# Patient Record
Sex: Male | Born: 1995 | Race: Black or African American | Hispanic: No | Marital: Single | Smoking: Current every day smoker
Health system: Southern US, Community
[De-identification: ages and names within clinical notes are randomized; demographics above are authoritative.]

## PROBLEM LIST (undated history)

## (undated) DIAGNOSIS — A64 Unspecified sexually transmitted disease: Secondary | ICD-10-CM

---
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
## 2015-02-23 ENCOUNTER — Encounter (HOSPITAL_COMMUNITY): Payer: Self-pay | Admitting: *Deleted

## 2015-02-23 ENCOUNTER — Emergency Department (HOSPITAL_COMMUNITY): Payer: Medicaid Other

## 2015-02-23 ENCOUNTER — Emergency Department (HOSPITAL_COMMUNITY)
Admission: EM | Admit: 2015-02-23 | Discharge: 2015-02-23 | Disposition: A | Discharge status: Home / Self Care | Payer: Medicaid Other | Attending: Emergency Medicine | Admitting: Emergency Medicine

## 2015-02-23 DIAGNOSIS — Z720 Tobacco use: Secondary | ICD-10-CM | POA: Diagnosis not present

## 2015-02-23 DIAGNOSIS — S62316A Displaced fracture of base of fifth metacarpal bone, right hand, initial encounter for closed fracture: Secondary | ICD-10-CM | POA: Insufficient documentation

## 2015-02-23 DIAGNOSIS — Y998 Other external cause status: Secondary | ICD-10-CM | POA: Insufficient documentation

## 2015-02-23 DIAGNOSIS — W228XXA Striking against or struck by other objects, initial encounter: Secondary | ICD-10-CM | POA: Diagnosis not present

## 2015-02-23 DIAGNOSIS — Y9289 Other specified places as the place of occurrence of the external cause: Secondary | ICD-10-CM | POA: Diagnosis not present

## 2015-02-23 DIAGNOSIS — S6991XA Unspecified injury of right wrist, hand and finger(s), initial encounter: Secondary | ICD-10-CM | POA: Diagnosis present

## 2015-02-23 DIAGNOSIS — Y9389 Activity, other specified: Secondary | ICD-10-CM | POA: Diagnosis not present

## 2015-02-23 DIAGNOSIS — S62339A Displaced fracture of neck of unspecified metacarpal bone, initial encounter for closed fracture: Secondary | ICD-10-CM

## 2015-02-23 MED ORDER — LIDOCAINE HCL 2 % IJ SOLN
20.0000 mL | Freq: Once | INTRAMUSCULAR | Status: AC
  Administered 2015-02-23: 400 mg via INTRADERMAL
  Filled 2015-02-23: qty 20

## 2015-02-23 MED ORDER — HYDROCODONE-ACETAMINOPHEN 5-325 MG PO TABS: ORAL_TABLET | ORAL | Status: DC

## 2015-02-23 MED ORDER — HYDROCODONE-ACETAMINOPHEN 5-325 MG PO TABS
1.0000 | ORAL_TABLET | Freq: Once | ORAL | Status: AC
Start: 2015-02-23 — End: 2015-02-23
  Administered 2015-02-23: 1 via ORAL
  Filled 2015-02-23: qty 1

## 2015-02-23 NOTE — Consult Note (Signed)
Reason for Consult:5th metacarpal Fx right hand closed Referring Physician: ER staff  Jerome Simpson is an 19 y.o. male.  HPI: Patient presents status post punching a wall earlier today with a swollen painful deformed right hand. He has a fifth metacarpal fracture displaced. I discussed these issues with him and his family. He denies other issues including neck back chest or abdominal pain. He is alert and oriented. He and I discussed all issues at length.  History reviewed. No pertinent past medical history.  History reviewed. No pertinent past surgical history.  No family history on file.  Social History:  reports that he has been smoking Cigarettes.  He has a 2.5 pack-year smoking history. He has never used smokeless tobacco. He reports that he drinks alcohol. He reports that he does not use illicit drugs.  Allergies: No Known Allergies  Medications: I have reviewed the patient's current medications.  No results found for this or any previous visit (from the past 48 hour(s)).  Dg Hand Complete Right  02/23/2015   CLINICAL DATA:  Hit a wall today  EXAM: RIGHT HAND - COMPLETE 3+ VIEW  COMPARISON:  None.  FINDINGS: Fracture the distal fifth metacarpal with moderate angulation. No other fracture identified. No arthropathy.  IMPRESSION: Angulated fracture fifth metacarpal.   Electronically Signed   By: Marlan Palauharles  Clark M.D.   On: 02/23/2015 17:41    Review of Systems  Constitutional: Negative.   Eyes: Negative.   Respiratory: Negative.   Cardiovascular: Negative.   Gastrointestinal: Negative.   Genitourinary: Negative.   Musculoskeletal:       Fifth metacarpal fracture right hand  Skin: Negative.   Neurological: Negative.   Endo/Heme/Allergies: Negative.   Psychiatric/Behavioral: Negative.    Blood pressure 125/77, pulse 58, temperature 98.2 F (36.8 C), temperature source Oral, resp. rate 16, height 6' (1.829 m), weight 77.111 kg (170 lb), SpO2 100 %. Physical Exam right fifth  closed metacarpal fracture he has no evidence of compartment syndrome or infection. No evidence of dystrophy or neurovascular compromise. He is incredibly painful and pain focused at this juncture. The patient is alert and oriented in no acute distress. The patient complains of pain in the affected upper extremity.  The patient is noted to have a normal HEENT exam. Lung fields show equal chest expansion and no shortness of breath. Abdomen exam is nontender without distention. Lower extremity examination does not show any fracture dislocation or blood clot symptoms. Pelvis is stable and the neck and back are stable and nontender. Assessment/Plan: Right fifth metacarpal fracture closed displaced.  Procedure note the patient was consented verbally and given a metacarpal/ulnar nerve block. Following this the patient underwent fluoroscopy and close reduction. The first reduction had to be adjusted as I felt we could improve it and I did so followed by application of a cast. 3 point mold was applied and postreduction x-rays looked excellent. The x-rays were performed examined and interpreted by myself and looked to be excellent.  Thus we performed #1 closed reduction fifth metacarpal fracture #2 ulnar nerve block with 20 mL of lidocaine 2% without and after #3 AP lateral and oblique x-rays by myself.  He'll return see me office in 7 days careful observation notify me same problems occur.  Keep bandage clean and dry.  Call for any problems.  No smoking.  Criteria for driving a car: you should be off your pain medicine for 7-8 hours, able to drive one handed(confident), thinking clearly and feeling able in your judgement to  drive. Continue elevation as it will decrease swelling.  If instructed by MD move your fingers within the confines of the bandage/splint.  Use ice if instructed by your MD. Call immediately for any sudden loss of feeling in your hand/arm or change in functional abilities of the  extremity.We recommend that you to take vitamin C 1000 mg a day to promote healing. We also recommend that if you require  pain medicine that you take a stool softener to prevent constipation as most pain medicines will have constipation side effects. We recommend either Peri-Colace or Senokot and recommend that you also consider adding MiraLAX to prevent the constipation affects from pain medicine if you are required to use them. These medicines are over the counter and maybe purchased at a local pharmacy. A cup of yogurt and a probiotic can also be helpful during the recovery process as the medicines can disrupt your intestinal environment.   Karen Chafe 02/23/2015, 9:40 PM

## 2015-02-23 NOTE — ED Notes (Signed)
The pt is c/o rt hand pain.  He struck a wall this am  Swelling and painful

## 2015-02-23 NOTE — Progress Notes (Signed)
Orthopedic Tech Progress Note Patient Details:  Jerome Simpson 02-28-1996 [id number]  Casting Type of Cast: Short arm cast Cast Location: RUE Cast Intervention: Application     Jennye MoccasinHughes, Seleny Allbright Craig 02/23/2015, 9:37 PM

## 2015-02-23 NOTE — ED Provider Notes (Signed)
CSN: [id number]     Arrival date & time 02/23/15  1632 History  This chart was scribed for non-physician practitioner Wynetta EmeryNicole Yee Gangi, PA working with Margarita Grizzleanielle Ray, MD, by Tanda RockersMargaux Venter, ED Scribe. This patient was seen in room TR11C/TR11C and the patient's care was started at 5:48 PM.     Chief Complaint  Patient presents with  . Hand Injury   The history is provided by the patient. No language interpreter was used.     HPI Comments: Jerome Simpson is a 19 y.o. male who is right hand dominant presents to the Emergency Department complaining of right hand injury that occurred earlier today. Pt states that he punched a wall, causing increased pain and swelling to the area. He has not taken any pain medication for the pain or iced his hand.  Pt denies any other symptoms. Pain is 8 out of 10 and exacerbated by movement and palpation.  History reviewed. No pertinent past medical history. History reviewed. No pertinent past surgical history. No family history on file. History  Substance Use Topics  . Smoking status: Current Every Day Smoker -- 0.50 packs/day for 5 years    Types: Cigarettes  . Smokeless tobacco: Never Used  . Alcohol Use: Yes    Review of Systems  A complete 10 system review of systems was obtained and all systems are negative except as noted in the HPI and PMH.    Allergies  Review of patient's allergies indicates no known allergies.  Home Medications   Prior to Admission medications   Medication Sig Start Date End Date Taking? Authorizing Provider  HYDROcodone-acetaminophen (NORCO/VICODIN) 5-325 MG per tablet Take 1-2 tablets by mouth every 6 hours as needed for pain and/or cough. 02/23/15   Yanitza Shvartsman, PA-C   Triage Vitals: BP 125/77 mmHg  Pulse 58  Temp(Src) 98.2 F (36.8 C) (Oral)  Resp 16  Ht 6' (1.829 m)  Wt 170 lb (77.111 kg)  BMI 23.05 kg/m2  SpO2 100%   Physical Exam  Constitutional: He is oriented to person, place, and time. He appears  well-developed and well-nourished. No distress.  HENT:  Head: Normocephalic and atraumatic.  Eyes: Conjunctivae and EOM are normal.  Neck: Neck supple. No tracheal deviation present.  Cardiovascular: Normal rate.   Pulmonary/Chest: Effort normal. No respiratory distress.  Musculoskeletal: Normal range of motion. He exhibits edema and tenderness.  No overlying skin changes to right hand: Fifth metacarpal is very swollen and tender to palpation range of motion to the fifth digit in flexion and extension is reduced secondary to pain.  Neurological: He is alert and oriented to person, place, and time.  Skin: Skin is warm and dry.  Psychiatric: He has a normal mood and affect. His behavior is normal.  Nursing note and vitals reviewed.   ED Course  Procedures (including critical care time)  DIAGNOSTIC STUDIES: Oxygen Saturation is 100% on RA, normal by my interpretation.    COORDINATION OF CARE: 5:49 PM-Discussed treatment plan which includes consult with hand surgeon and pain medication with pt at bedside and pt agreed to plan.   Labs Review Labs Reviewed - No data to display  Imaging Review Dg Hand Complete Right  02/23/2015   CLINICAL DATA:  Hit a wall today  EXAM: RIGHT HAND - COMPLETE 3+ VIEW  COMPARISON:  None.  FINDINGS: Fracture the distal fifth metacarpal with moderate angulation. No other fracture identified. No arthropathy.  IMPRESSION: Angulated fracture fifth metacarpal.   Electronically Signed   By: Leonette Mostharles  Chestine Spore M.D.   On: 02/23/2015 17:41     EKG Interpretation None      MDM   Final diagnoses:  Boxer's fracture, closed, initial encounter    Filed Vitals:   02/23/15 1636  BP: 125/77  Pulse: 58  Temp: 98.2 F (36.8 C)  TempSrc: Oral  Resp: 16  Height: 6' (1.829 m)  Weight: 170 lb (77.111 kg)  SpO2: 100%    Medications  HYDROcodone-acetaminophen (NORCO/VICODIN) 5-325 MG per tablet 1 tablet (1 tablet Oral Given 02/23/15 1806)  lidocaine (XYLOCAINE) 2 %  (with pres) injection 400 mg (400 mg Intradermal Given 02/23/15 2044)    Jerome Simpson is a pleasant 19 y.o. male presenting with pain to fifth metacarpal of right hand after he punched a wall yesterday. Skin is undisturbed, no signs of flight bite. Patient has reduced range of motion to the small digit he is neurovascularly intact.  X-ray shows a angulated boxer's fracture. Patient given Vicodin, sling and ice.  Case discussed with Dr. Amanda Pea and who will reduce the dislocation in the ED. He is going to the OR now. Discussed case with patient and his mother who agreed to wait. Patient told to remain nothing by mouth in case he needs to go to the OR.  Dr. Amanda Pea is reduced and splinted the hand under portable fluoroscopy.   Evaluation does not show pathology that would require ongoing emergent intervention or inpatient treatment. Pt is hemodynamically stable and mentating appropriately. Discussed findings and plan with patient/guardian, who agrees with care plan. All questions answered. Return precautions discussed and outpatient follow up given.   Discharge Medication List as of 02/23/2015  9:36 PM    START taking these medications   Details  HYDROcodone-acetaminophen (NORCO/VICODIN) 5-325 MG per tablet Take 1-2 tablets by mouth every 6 hours as needed for pain and/or cough., Print         I personally performed the services described in this documentation, which was scribed in my presence. The recorded information has been reviewed and is accurate.     Wynetta Emery, PA-C 02/23/15 1610  Margarita Grizzle, MD 02/24/15 [phone]

## 2015-02-23 NOTE — Discharge Instructions (Signed)
Only use the arm sling for up to 2 days. Take the arm out and rotate the shoulder every 4 hours.   Rest, Ice intermittently (in the first 24-48 hours), Gentle compression with an Ace wrap, and elevate (Limb above the level of the heart)   Take up to 400mg  of ibuprofen (that is usually 2 over the counter pills)  3 times a day for 5 days. Take with food.  Please follow with your primary care doctor in the next 2 days for a check-up. They must obtain records for further management.   Do not hesitate to return to the Emergency Department for any new, worsening or concerning symptoms.     Boxer's Fracture Boxer's fracture is a broken bone (fracture) of the fourth or fifth metacarpal (ring or pinky finger). The metacarpal bones connect the wrist to the fingers and make up the arch of the hand. Boxer's fracture occurs toward the body (proximal) from the first knuckle. This injury is known as a boxer's fracture, because it often occurs from hitting an object with a closed fist. SYMPTOMS   Severe pain at the time of injury.  Pain and swelling around the first knuckle on the fourth or fifth finger.  Bruising (contusion) in the area within 48 hours of injury.  Visible deformity, such as a pushed down knuckle. This can occur if the fracture is complete, and the bone fragments separate enough to distort normal body shape.  Numbness or paralysis from swelling in the hand, causing pressure on the blood vessels or nerves (uncommon). CAUSES   Direct injury (trauma), such as a striking blow with the fist.  Indirect stress to the hand, such as twisting or violent muscle contraction (uncommon). RISK INCREASES WITH:  Punching an object with an unprotected knuckle.  Contact sports (football, rugby).  Sports that require hitting (boxing, martial arts).  History of bone or joint disease (osteoporosis). PREVENTION  Maintain physical fitness:  Muscle strength and  flexibility.  Endurance.  Cardiovascular fitness.  For participation in contact sports, wear proper protective equipment for the hand and make sure it fits properly.  Learn and use proper technique when hitting or punching. PROGNOSIS  When proper treatment is given, to ensure normal alignment of the bones, healing can usually be expected in 4 to 6 weeks. Occasionally, surgery is necessary.  RELATED COMPLICATIONS   Bone does not heal back together (nonunion).  Bone heals together in an improper position (malunion), causing twisting of the finger when making a fist.  Chronic pain, stiffness, or swelling of the hand.  Excessive bleeding in the hand, causing pressure and injury to nerves and blood vessels (rare).  Stopping of normal hand growth in children.  Infection of the wound, if skin is broken over the fracture (open fracture), or at the incision site if surgery is performed.  Shortening of injured bones.  Bony bump (prominence) in the palm or loss of shape of the knuckles.  Pain and weakness when gripping.  Arthritis of the affected joint, if the fracture goes into the joint, after repeated injury, or after delayed treatment.  Scarring around the knuckle, and limited motion. TREATMENT  Treatment varies, depending on the injury. The place in the hand where the injury occurs has a great deal of motion, which allows the hand to move properly. If the fracture is not aligned properly, this function may be decreased. If the bone ends are in proper alignment, treatment first involves ice and elevation of the injured hand, at or  above heart level, to reduce inflammation. Pain medicines help to relieve pain. Treatment also involves restraint by splinting, bandaging, casting, or bracing for 4 or more weeks.  If the fracture is out of alignment (displaced), or it involves the joint, surgery is usually recommended. Surgery typically involves cutting through the skin to place removable  pins, screws, and sometimes plates over the fracture. After surgery, the bone and joint are restrained for 4 or more weeks. After restraint (with or without surgery), stretching and strengthening exercises are needed to regain proper strength and function of the joint. Exercises may be done at home or with the assistance of a therapist. Depending on the sport and position played, a brace or splint may be recommended when first returning to sports.  MEDICATION   If pain medication is necessary, nonsteroidal anti-inflammatory medications, such as aspirin and ibuprofen, or other minor pain relievers, such as acetaminophen, are often recommended.  Do not take pain medication for 7 days before surgery.  Prescription pain relievers may be necessary. Use only as directed and only as much as you need. COLD THERAPY Cold treatment (icing) relieves pain and reduces inflammation. Cold treatment should be applied for 10 to 15 minutes every 2 to 3 hours for inflammation and pain, and immediately after any activity that aggravates your symptoms. Use ice packs or an ice massage. SEEK MEDICAL CARE IF:   Pain, tenderness, or swelling gets worse, despite treatment.  You experience pain, numbness, or coldness in the hand.  Blue, gray, or dark color appears in the fingernails.  You develop signs of infection, after surgery (fever, increased pain, swelling, redness, drainage of fluids, or bleeding in the surgical area).  You feel you have reinjured the hand.  New, unexplained symptoms develop. (Drugs used in treatment may produce side effects.) Document Released: 10/20/2005 Document Revised: 01/12/2012 Document Reviewed: 02/01/2009 [facility] Patient Information 2015 [place], [place]. This information is not intended to replace advice given to you by your health care provider. Make sure you discuss any questions you have with your health care provider.

## 2015-02-23 NOTE — ED Notes (Signed)
Dr.Gramig in to assess pt at this time. 

## 2015-07-09 ENCOUNTER — Encounter (HOSPITAL_COMMUNITY): Payer: Self-pay | Admitting: Emergency Medicine

## 2015-07-09 ENCOUNTER — Emergency Department (HOSPITAL_COMMUNITY)
Admission: EM | Admit: 2015-07-09 | Discharge: 2015-07-09 | Disposition: A | Discharge status: Home / Self Care | Payer: Medicaid Other | Attending: Emergency Medicine | Admitting: Emergency Medicine

## 2015-07-09 DIAGNOSIS — Z202 Contact with and (suspected) exposure to infections with a predominantly sexual mode of transmission: Secondary | ICD-10-CM | POA: Diagnosis not present

## 2015-07-09 DIAGNOSIS — R360 Urethral discharge without blood: Secondary | ICD-10-CM | POA: Diagnosis not present

## 2015-07-09 DIAGNOSIS — R369 Urethral discharge, unspecified: Secondary | ICD-10-CM

## 2015-07-09 DIAGNOSIS — Z720 Tobacco use: Secondary | ICD-10-CM | POA: Insufficient documentation

## 2015-07-09 MED ORDER — AZITHROMYCIN 250 MG PO TABS
1000.0000 mg | ORAL_TABLET | Freq: Once | ORAL | Status: AC
  Administered 2015-07-09: 1000 mg via ORAL
  Filled 2015-07-09: qty 4

## 2015-07-09 MED ORDER — CEFTRIAXONE SODIUM 250 MG IJ SOLR
250.0000 mg | Freq: Once | INTRAMUSCULAR | Status: AC
  Administered 2015-07-09: 250 mg via INTRAMUSCULAR
  Filled 2015-07-09: qty 250

## 2015-07-09 MED ORDER — LIDOCAINE HCL (PF) 1 % IJ SOLN
5.0000 mL | Freq: Once | INTRAMUSCULAR | Status: AC
  Administered 2015-07-09: 5 mL
  Filled 2015-07-09: qty 5

## 2015-07-09 NOTE — Discharge Instructions (Signed)

## 2015-07-09 NOTE — ED Notes (Signed)
Pt from home for clear penile discharge x1 day, denies any pain or n/v/d at this time. States "I don't feel right." pt eating a sandwhich as this RN trying to assess pt and on telephone. Pt states he has not been told by sexual partners that he was exposed. No other complaints.

## 2015-07-09 NOTE — ED Provider Notes (Signed)
CSN: [id number]     Arrival date & time 07/09/15  1409 History  This chart was scribed for non-physician practitioner, Roxy Horseman, PA-C working with Tilden Fossa, MD by Doreatha Martin, ED scribe. This patient was seen in room TR09C/TR09C and the patient's care was started at 2:40 PM     Chief Complaint  Patient presents with  . SEXUALLY TRANSMITTED DISEASE   The history is provided by the patient. No language interpreter was used.    HPI Comments: Jerome Simpson is a 19 y.o. male who presents to the Emergency Department with a chief complaint of clear penile discharge onset yesterday. Pt reports that he believes he was exposed to Chlamydia. He states that he is not sure of who he had exposure from. He denies dysuria, fever, chills, testicular pain, penile pain.   History reviewed. No pertinent past medical history. History reviewed. No pertinent past surgical history. No family history on file. Social History  Substance Use Topics  . Smoking status: Current Every Day Smoker -- 0.50 packs/day for 5 years    Types: Cigarettes  . Smokeless tobacco: Never Used  . Alcohol Use: Yes    Review of Systems  Constitutional: Negative for fever and chills.  Genitourinary: Positive for discharge ( clear). Negative for dysuria, penile pain and testicular pain.   Allergies  Review of patient's allergies indicates no known allergies.  Home Medications   Prior to Admission medications   Medication Sig Start Date End Date Taking? Authorizing Provider  HYDROcodone-acetaminophen (NORCO/VICODIN) 5-325 MG per tablet Take 1-2 tablets by mouth every 6 hours as needed for pain and/or cough. 02/23/15   Nicole Pisciotta, PA-C   BP 119/77 mmHg  Pulse 100  Temp(Src) 99.8 F (37.7 C) (Oral)  Resp 16  Ht 6' (1.829 m)  Wt 157 lb 6.4 oz (71.396 kg)  BMI 21.34 kg/m2  SpO2 97% Physical Exam  Constitutional: He is oriented to person, place, and time. He appears well-developed and well-nourished.  HENT:   Head: Normocephalic and atraumatic.  Eyes: Conjunctivae and EOM are normal. Pupils are equal, round, and reactive to light.  Neck: Normal range of motion. Neck supple.  Cardiovascular: Normal rate.   Pulmonary/Chest: Effort normal. No respiratory distress.  Abdominal: He exhibits no distension.  Genitourinary:  Circumcised male, no discharge, masses, lesions, or other abnormality  Musculoskeletal: Normal range of motion.  Neurological: He is alert and oriented to person, place, and time.  Skin: Skin is warm and dry.  Psychiatric: He has a normal mood and affect. His behavior is normal.  Nursing note and vitals reviewed.  ED Course  Procedures (including critical care time) DIAGNOSTIC STUDIES: Oxygen Saturation is 97% on RA, normal by my interpretation.    COORDINATION OF CARE: 2:43 PM Discussed treatment plan with pt at bedside and pt agreed to plan.   Labs Review Labs Reviewed - No data to display  Imaging Review No results found. I have personally reviewed and evaluated these images and lab results as part of my medical decision-making.   EKG Interpretation None      MDM   Final diagnoses:  STD exposure  Penile discharge    Patient with STD exposure and penile discharge.  Will treat with rocephin and azithro.  I personally performed the services described in this documentation, which was scribed in my presence. The recorded information has been reviewed and is accurate.    Roxy Horseman, PA-C 07/09/15 1540  Tilden Fossa, MD 07/09/15 [phone]

## 2015-07-09 NOTE — ED Notes (Signed)
Declined W/C at D/C and was escorted to lobby by RN. 

## 2015-07-10 LAB — GC/CHLAMYDIA PROBE AMP (~~LOC~~) NOT AT ARMC
Chlamydia: POSITIVE — AB
Neisseria Gonorrhea: NEGATIVE

## 2015-07-11 ENCOUNTER — Telehealth (HOSPITAL_COMMUNITY): Payer: Self-pay | Admitting: Emergency Medicine

## 2015-07-11 NOTE — Telephone Encounter (Signed)
Positive Chlamydia culture Treated with Rocephin and Zithromax [facility] faxed  Will contact patient.

## 2015-07-12 ENCOUNTER — Telehealth (HOSPITAL_BASED_OUTPATIENT_CLINIC_OR_DEPARTMENT_OTHER): Payer: Self-pay | Admitting: *Deleted

## 2015-07-13 ENCOUNTER — Telehealth (HOSPITAL_BASED_OUTPATIENT_CLINIC_OR_DEPARTMENT_OTHER): Payer: Self-pay | Admitting: Emergency Medicine

## 2015-08-12 ENCOUNTER — Telehealth (HOSPITAL_COMMUNITY): Payer: Self-pay

## 2015-08-12 NOTE — Telephone Encounter (Signed)
Unable to reach by phone or mail.  Chart closed.   

## 2016-02-15 ENCOUNTER — Encounter (HOSPITAL_COMMUNITY): Payer: Self-pay | Admitting: Emergency Medicine

## 2016-02-15 ENCOUNTER — Emergency Department (HOSPITAL_COMMUNITY)
Admission: EM | Admit: 2016-02-15 | Discharge: 2016-02-16 | Disposition: A | Discharge status: Home / Self Care | Payer: Medicaid Other | Attending: Emergency Medicine | Admitting: Emergency Medicine

## 2016-02-15 ENCOUNTER — Emergency Department (HOSPITAL_COMMUNITY): Payer: Medicaid Other

## 2016-02-15 DIAGNOSIS — J40 Bronchitis, not specified as acute or chronic: Secondary | ICD-10-CM | POA: Diagnosis not present

## 2016-02-15 DIAGNOSIS — F17210 Nicotine dependence, cigarettes, uncomplicated: Secondary | ICD-10-CM | POA: Diagnosis not present

## 2016-02-15 DIAGNOSIS — J069 Acute upper respiratory infection, unspecified: Secondary | ICD-10-CM | POA: Insufficient documentation

## 2016-02-15 DIAGNOSIS — R079 Chest pain, unspecified: Secondary | ICD-10-CM | POA: Diagnosis present

## 2016-02-15 DIAGNOSIS — R000 Tachycardia, unspecified: Secondary | ICD-10-CM | POA: Diagnosis not present

## 2016-02-15 LAB — BASIC METABOLIC PANEL
ANION GAP: 9 (ref 5–15)
BUN: 6 mg/dL (ref 6–20)
CALCIUM: 9.1 mg/dL (ref 8.9–10.3)
CHLORIDE: 104 mmol/L (ref 101–111)
CO2: 27 mmol/L (ref 22–32)
Creatinine, Ser: 1.07 mg/dL (ref 0.61–1.24)
GFR calc Af Amer: >60 mL/min (ref >60)
GFR calc non Af Amer: >60 mL/min (ref >60)
GLUCOSE: 103 mg/dL — AB (ref 65–99)
POTASSIUM: 3.8 mmol/L (ref 3.5–5.1)
Sodium: 140 mmol/L (ref 135–145)

## 2016-02-15 LAB — CBC
HCT: 43.4 % (ref 39.0–52.0)
HEMOGLOBIN: 15.1 g/dL (ref 13.0–17.0)
MCH: 30.4 pg (ref 26.0–34.0)
MCHC: 34.8 g/dL (ref 30.0–36.0)
MCV: 87.5 fL (ref 78.0–100.0)
Platelets: 199 10*3/uL (ref 150–400)
RBC: 4.96 MIL/uL (ref 4.22–5.81)
RDW: 12.7 % (ref 11.5–15.5)
WBC: 7.7 10*3/uL (ref 4.0–10.5)

## 2016-02-15 LAB — I-STAT TROPONIN, ED: TROPONIN I, POC: 0 ng/mL (ref 0.00–0.08)

## 2016-02-15 MED ORDER — PREDNISONE 20 MG PO TABS
60.0000 mg | ORAL_TABLET | Freq: Once | ORAL | Status: AC
  Administered 2016-02-15: 60 mg via ORAL
  Filled 2016-02-15: qty 3

## 2016-02-15 MED ORDER — ALBUTEROL SULFATE HFA 108 (90 BASE) MCG/ACT IN AERS
2.0000 | INHALATION_SPRAY | Freq: Once | RESPIRATORY_TRACT | Status: AC
  Administered 2016-02-15: 2 via RESPIRATORY_TRACT
  Filled 2016-02-15: qty 6.7

## 2016-02-15 MED ORDER — KETOROLAC TROMETHAMINE 30 MG/ML IJ SOLN
30.0000 mg | Freq: Once | INTRAMUSCULAR | Status: AC
  Administered 2016-02-15: 30 mg via INTRAVENOUS
  Filled 2016-02-15: qty 1

## 2016-02-15 NOTE — ED Provider Notes (Signed)
CSN: [id number]     Arrival date & time 02/15/16  2156 History  By signing my name below, I, Doreatha MartinEva Mathews, attest that this documentation has been prepared under the direction and in the presence of Shon Batonourtney F Gabriela Irigoyen, MD. Electronically Signed: Doreatha MartinEva Mathews, ED Scribe. 02/15/2016. 11:26 PM.     Chief Complaint  Patient presents with  . Chest Pain   The history is provided by the patient. No language interpreter was used.   HPI Comments: Jerome Simpson is a 20 y.o. male otherwise healthy on no daily medications who presents to the Emergency Department complaining of moderate, sharp, 8/10 substernal and right-sided CP onset this morning with associated SOB, subjective fever. Pt also reports productive cough with onset this week. Per pt, he ran, smoked cigarettes and marijuana prior to the onset of his CP today, but his pain is not currently exertional. He states his pain is worsened in certain positions and with deep inhalation. Pt denies taking OTC medications at home to improve symptoms. Denies cocaine use. He is currently a daily marijuana and tobacco smoker.  No Hx of PE/DVT, recent long travel, surgery, fracture, prolonged immobilization. NKDA. Denies diaphoresis, recent leg swelling.   History reviewed. No pertinent past medical history. History reviewed. No pertinent past surgical history. No family history on file. Social History  Substance Use Topics  . Smoking status: Current Every Day Smoker -- 0.00 packs/day for 0 years    Types: Cigarettes  . Smokeless tobacco: Never Used  . Alcohol Use: Yes    Review of Systems  Constitutional: Positive for fever (subjective) and chills. Negative for diaphoresis.  Respiratory: Positive for cough and shortness of breath.   Cardiovascular: Positive for chest pain. Negative for leg swelling.  All other systems reviewed and are negative.  Allergies  Review of patient's allergies indicates no known allergies.  Home Medications   Prior to  Admission medications   Medication Sig Start Date End Date Taking? Authorizing Provider  albuterol (PROVENTIL HFA;VENTOLIN HFA) 108 (90 Base) MCG/ACT inhaler Inhale 2 puffs into the lungs every 4 (four) hours as needed for wheezing or shortness of breath. 02/16/16   Shon Batonourtney F Chrisa Hassan, MD  ibuprofen (ADVIL,MOTRIN) 600 MG tablet Take 1 tablet (600 mg total) by mouth every 6 (six) hours as needed. 02/16/16   Shon Batonourtney F Montrice Montuori, MD  predniSONE (DELTASONE) 20 MG tablet Take 3 tablets (60 mg total) by mouth daily with breakfast. 02/16/16   Shon Batonourtney F Hayes Rehfeldt, MD   BP 108/64 mmHg  Pulse 88  Temp(Src) 100.3 F (37.9 C) (Oral)  Resp 20  Wt 175 lb (79.379 kg)  SpO2 97% Physical Exam  Constitutional: He is oriented to person, place, and time. He appears well-developed and well-nourished. No distress.  HENT:  Head: Normocephalic and atraumatic.  Eyes: Pupils are equal, round, and reactive to light.  Cardiovascular: Normal rate, regular rhythm and normal heart sounds.   No murmur heard. Pulmonary/Chest: Effort normal. No respiratory distress. He has wheezes. He exhibits no tenderness.  Diffuse expiratory wheezing  Abdominal: Soft. Bowel sounds are normal. There is no tenderness. There is no rebound.  Musculoskeletal: He exhibits no edema.  Neurological: He is alert and oriented to person, place, and time.  Skin: Skin is warm and dry.  Psychiatric: He has a normal mood and affect.  Nursing note and vitals reviewed.   ED Course  Procedures (including critical care time) DIAGNOSTIC STUDIES: Oxygen Saturation is 100% on RA, normal by my interpretation.    COORDINATION  OF CARE: 11:21 PM Discussed treatment plan with pt at bedside which includes lab work, CXR, EKG and pt agreed to plan.   Labs Review Labs Reviewed  BASIC METABOLIC PANEL - Abnormal; Notable for the following:    Glucose, Bld 103 (*)    All other components within normal limits  CBC  I-STAT TROPOININ, ED    Imaging Review Dg  Chest 2 View  02/15/2016  CLINICAL DATA:  Mid chest pain and shortness of breath. EXAM: CHEST  2 VIEW COMPARISON:  None. FINDINGS: Trachea is midline. Heart size normal. Lungs are clear. No pleural fluid. IMPRESSION: Negative. Electronically Signed   By: Leanna Battles M.D.   On: 02/15/2016 22:32   I have personally reviewed and evaluated these images and lab results as part of my medical decision-making.   EKG Interpretation   Date/Time:  Friday February 15 2016 22:07:33 EDT Ventricular Rate:  101 PR Interval:  166 QRS Duration: 90 QT Interval:  320 QTC Calculation: 414 R Axis:   75 Text Interpretation:  Sinus tachycardia Moderate voltage criteria for LVH,  may be normal variant Borderline ECG No prior for comparison Confirmed by  Flavia Bruss  MD, Toni Amend ([id number]) on 02/15/2016 11:19:06 PM      MDM   Final diagnoses:  Bronchitis  URI (upper respiratory infection)    Patient presents for chest pain, shortness of breath, and noted here to have a fever. He's wheezing on exam. Patient was given an inhaler, Toradol, and prednisone. He is a smoker. Workup is largely reassuring. He is mildly tachycardic but that is likely related to fever. Doubt PE.  1:41 AM On recheck, patient reports improvement of his shortness of breath. He is no longer wheezing. Chest pain is improved with treatment. Likely acute bronchitis viral etiology. Will discharge home with hfa and prednisone.  After history, exam, and medical workup I feel the patient has been appropriately medically screened and is safe for discharge home. Pertinent diagnoses were discussed with the patient. Patient was given return precautions.  I personally performed the services described in this documentation, which was scribed in my presence. The recorded information has been reviewed and is accurate.   Shon Baton, MD 02/16/16 [phone]

## 2016-02-15 NOTE — ED Notes (Signed)
Pt. reports central chest pain/tightness with SOB , productive cough and emesis onset today , denies diaphoresis or fever .

## 2016-02-16 MED ORDER — ALBUTEROL SULFATE HFA 108 (90 BASE) MCG/ACT IN AERS: 2.0000 | INHALATION_SPRAY | RESPIRATORY_TRACT | Status: DC | PRN

## 2016-02-16 MED ORDER — IBUPROFEN 600 MG PO TABS: 600.0000 mg | ORAL_TABLET | Freq: Four times a day (QID) | ORAL | Status: DC | PRN

## 2016-02-16 MED ORDER — PREDNISONE 20 MG PO TABS
60.0000 mg | ORAL_TABLET | Freq: Every day | ORAL | Status: DC
Start: 2016-02-16 — End: 2016-06-13

## 2016-02-16 NOTE — Discharge Instructions (Signed)

## 2016-03-29 ENCOUNTER — Encounter (HOSPITAL_COMMUNITY): Payer: Self-pay | Admitting: *Deleted

## 2016-03-29 DIAGNOSIS — Y9389 Activity, other specified: Secondary | ICD-10-CM | POA: Insufficient documentation

## 2016-03-29 DIAGNOSIS — Y998 Other external cause status: Secondary | ICD-10-CM | POA: Insufficient documentation

## 2016-03-29 DIAGNOSIS — Y9289 Other specified places as the place of occurrence of the external cause: Secondary | ICD-10-CM | POA: Diagnosis not present

## 2016-03-29 DIAGNOSIS — X58XXXA Exposure to other specified factors, initial encounter: Secondary | ICD-10-CM | POA: Insufficient documentation

## 2016-03-29 DIAGNOSIS — S6992XA Unspecified injury of left wrist, hand and finger(s), initial encounter: Secondary | ICD-10-CM | POA: Diagnosis present

## 2016-03-29 DIAGNOSIS — F17210 Nicotine dependence, cigarettes, uncomplicated: Secondary | ICD-10-CM | POA: Diagnosis not present

## 2016-03-29 NOTE — ED Notes (Signed)
The pt is c/o  Lt thumbnail pain.  He was  Biting his nails and the area surrounding the thumbnail is red swollen and painful

## 2016-03-30 ENCOUNTER — Emergency Department (HOSPITAL_COMMUNITY)
Admission: EM | Admit: 2016-03-30 | Discharge: 2016-03-30 | Disposition: A | Discharge status: Home / Self Care | Payer: Medicaid Other | Attending: Emergency Medicine | Admitting: Emergency Medicine

## 2016-03-30 ENCOUNTER — Encounter (HOSPITAL_COMMUNITY): Payer: Self-pay | Admitting: *Deleted

## 2016-03-30 DIAGNOSIS — F17210 Nicotine dependence, cigarettes, uncomplicated: Secondary | ICD-10-CM | POA: Insufficient documentation

## 2016-03-30 DIAGNOSIS — Z79899 Other long term (current) drug therapy: Secondary | ICD-10-CM | POA: Diagnosis not present

## 2016-03-30 DIAGNOSIS — L03012 Cellulitis of left finger: Secondary | ICD-10-CM | POA: Insufficient documentation

## 2016-03-30 DIAGNOSIS — M79645 Pain in left finger(s): Secondary | ICD-10-CM | POA: Diagnosis present

## 2016-03-30 MED ORDER — IBUPROFEN 200 MG PO TABS
600.0000 mg | ORAL_TABLET | Freq: Once | ORAL | Status: AC
  Administered 2016-03-30: 600 mg via ORAL
  Filled 2016-03-30: qty 1

## 2016-03-30 MED ORDER — LIDOCAINE HCL (PF) 1 % IJ SOLN
10.0000 mL | Freq: Once | INTRAMUSCULAR | Status: AC
  Administered 2016-03-30: 10 mL via INTRADERMAL
  Filled 2016-03-30: qty 10

## 2016-03-30 NOTE — ED Provider Notes (Signed)
CSN: [id number]     Arrival date & time 03/30/16  [id number] History   First MD Initiated Contact with Patient 03/30/16 [phone]     Chief Complaint  Patient presents with  . Skin Problem     (Consider location/radiation/quality/duration/timing/severity/associated sxs/prior Treatment) Patient is a 20 y.o. male presenting with general illness. The history is provided by the patient.  Illness Location:  Left thumb pain Severity:  Moderate Onset quality:  Gradual Duration:  3 days Timing:  Constant Progression:  Worsening Chronicity:  New Context:  Pain and swelling to the right thumb for the last several days. Getting progressively worse. No systemic symptoms. Associated symptoms: rash   Associated symptoms: no abdominal pain, no fever, no headaches, no nausea and no vomiting     History reviewed. No pertinent past medical history. History reviewed. No pertinent past surgical history. History reviewed. No pertinent family history. Social History  Substance Use Topics  . Smoking status: Current Every Day Smoker -- 0.00 packs/day for 0 years    Types: Cigarettes  . Smokeless tobacco: Never Used  . Alcohol Use: Yes    Review of Systems  Constitutional: Negative for fever and chills.  Gastrointestinal: Negative for nausea, vomiting and abdominal pain.  Skin: Positive for rash.  Neurological: Negative for light-headedness and headaches.  All other systems reviewed and are negative.     Allergies  Review of patient's allergies indicates no known allergies.  Home Medications   Prior to Admission medications   Medication Sig Start Date End Date Taking? Authorizing Provider  albuterol (PROVENTIL HFA;VENTOLIN HFA) 108 (90 Base) MCG/ACT inhaler Inhale 2 puffs into the lungs every 4 (four) hours as needed for wheezing or shortness of breath. 02/16/16  Yes Shon Batonourtney F Horton, MD  ibuprofen (ADVIL,MOTRIN) 600 MG tablet Take 1 tablet (600 mg total) by mouth every 6 (six) hours as  needed. Patient not taking: Reported on 03/30/2016 02/16/16   Shon Batonourtney F Horton, MD  predniSONE (DELTASONE) 20 MG tablet Take 3 tablets (60 mg total) by mouth daily with breakfast. Patient not taking: Reported on 03/30/2016 02/16/16   Shon Batonourtney F Horton, MD   BP 130/84 mmHg  Pulse 72  Temp(Src) 98.9 F (37.2 C) (Oral)  Resp 14  Ht 6' (1.829 m)  Wt 81.647 kg  BMI 24.41 kg/m2  SpO2 99% Physical Exam  Constitutional: He is oriented to person, place, and time. He appears well-developed and well-nourished. No distress.  HENT:  Head: Normocephalic and atraumatic.  Eyes: EOM are normal. Pupils are equal, round, and reactive to light.  Cardiovascular: Normal rate, regular rhythm and intact distal pulses.   Pulmonary/Chest: Effort normal. No respiratory distress.  Abdominal: Soft. There is no tenderness.  Musculoskeletal: He exhibits no edema.  Paronychia to the lateral aspect of his left thumb. No evidence of felon.  No other fluctuance. No surrounding cellulitis.  Neurological: He is alert and oriented to person, place, and time.  Skin: Skin is warm and dry.    ED Course  .Marland Kitchen.Incision and Drainage Date/Time: 03/30/2016 11:32 AM Performed by: Lindalou Hose'ROURKE, Amariss Detamore Authorized by: Lindalou Hose'ROURKE, Samai Corea Consent: Verbal consent obtained. Consent given by: patient Patient identity confirmed: verbally with patient and arm band Indications for incision and drainage: Paronychia. Body area: upper extremity Location details: left thumb Anesthesia: digital block Local anesthetic: lidocaine 1% without epinephrine Anesthetic total: 4 ml Patient sedated: no Scalpel size: 11 Incision type: single straight Complexity: simple Drainage: purulent Drainage amount: moderate Wound treatment: wound left open Patient tolerance: Patient tolerated the  procedure well with no immediate complications   (including critical care time) Labs Review Labs Reviewed - No data to display  Imaging Review No results found. I  have personally reviewed and evaluated these images and lab results as part of my medical decision-making.   EKG Interpretation None      MDM   Final diagnoses:  Paronychia of thumb, left    20 year old male presenting with a left thumb paronychia. No evidence of felon on exam. No evidence of tenosynovitis. No surrounding cellulitis. No evidence of any other fluctuance to indicate an abscess. Full range of motion of all interphalangeal areas so doubt septic joint. Well-appearing in no acute distress with normal and stable vital signs sent out acute pathology such as sepsis.  Incision and drainage as above. Wound left open. Encouraged to do warm water soaks the next couple days. Encouraged to use bacitracin. Encouraged wound recheck in 3-5 days with primary care doctor or an urgent care center. Encouraged to return to the emergency department should he have worsening of symptoms.  Patient discharged in stable condition.  Lindalou Hose, MD 03/30/16 1135  Gwyneth Sprout, MD 04/01/16 [phone]

## 2016-03-30 NOTE — ED Notes (Signed)
Pt reports swelling and pain to LT thumb since Thursday . Pt reports LT thumb hurts more and swelling is worse today.

## 2016-03-30 NOTE — Discharge Instructions (Signed)
Perform warm water and soap soaks to your hand multiple times per day for the next several days.  Apply bacitracin to the area twice a day.  Have your wound rechecked in the next couple days for reevaluation.  Return to the emergency department should you have worsening of pain, swelling, redness.

## 2016-03-30 NOTE — ED Notes (Signed)
Pt decided not to wait, pt left.

## 2016-04-15 ENCOUNTER — Emergency Department (HOSPITAL_COMMUNITY)
Admission: EM | Admit: 2016-04-15 | Discharge: 2016-04-15 | Disposition: A | Discharge status: Home / Self Care | Payer: Medicaid Other | Attending: Emergency Medicine | Admitting: Emergency Medicine

## 2016-04-15 ENCOUNTER — Encounter (HOSPITAL_COMMUNITY): Payer: Self-pay

## 2016-04-15 DIAGNOSIS — F17210 Nicotine dependence, cigarettes, uncomplicated: Secondary | ICD-10-CM | POA: Diagnosis not present

## 2016-04-15 DIAGNOSIS — Z202 Contact with and (suspected) exposure to infections with a predominantly sexual mode of transmission: Secondary | ICD-10-CM | POA: Diagnosis not present

## 2016-04-15 DIAGNOSIS — Z79899 Other long term (current) drug therapy: Secondary | ICD-10-CM | POA: Insufficient documentation

## 2016-04-15 DIAGNOSIS — Z113 Encounter for screening for infections with a predominantly sexual mode of transmission: Secondary | ICD-10-CM | POA: Diagnosis present

## 2016-04-15 DIAGNOSIS — R369 Urethral discharge, unspecified: Secondary | ICD-10-CM | POA: Insufficient documentation

## 2016-04-15 DIAGNOSIS — Z711 Person with feared health complaint in whom no diagnosis is made: Secondary | ICD-10-CM

## 2016-04-15 LAB — URINALYSIS, ROUTINE W REFLEX MICROSCOPIC
BILIRUBIN URINE: NEGATIVE
Glucose, UA: NEGATIVE mg/dL
HGB URINE DIPSTICK: NEGATIVE
Ketones, ur: NEGATIVE mg/dL
Leukocytes, UA: NEGATIVE
Nitrite: NEGATIVE
PH: 8 (ref 5.0–8.0)
Protein, ur: NEGATIVE mg/dL
SPECIFIC GRAVITY, URINE: 1.015 (ref 1.005–1.030)

## 2016-04-15 MED ORDER — AZITHROMYCIN 250 MG PO TABS
1000.0000 mg | ORAL_TABLET | Freq: Once | ORAL | Status: AC
  Administered 2016-04-15: 1000 mg via ORAL
  Filled 2016-04-15: qty 4

## 2016-04-15 MED ORDER — LIDOCAINE HCL (PF) 1 % IJ SOLN
INTRAMUSCULAR | Status: AC
  Administered 2016-04-15: 1 mL
  Filled 2016-04-15: qty 5

## 2016-04-15 MED ORDER — CEFTRIAXONE SODIUM 250 MG IJ SOLR
250.0000 mg | Freq: Once | INTRAMUSCULAR | Status: AC
  Administered 2016-04-15: 250 mg via INTRAMUSCULAR
  Filled 2016-04-15: qty 250

## 2016-04-15 NOTE — ED Provider Notes (Signed)
CSN: [id number]     Arrival date & time 04/15/16  1620 History   First MD Initiated Contact with Patient 04/15/16 1641     Chief Complaint  Patient presents with  . std check    HPI  Mr. Jerome Simpson is a 89 old male presenting for an STD check. Patient reports he has had yellow penile discharge since yesterday. He does admit to unprotected intercourse with multiple partners. He denies known STDs in his partners or if they are symptomatic at this time. Denies fevers, chills, abdominal pain, penile pain, testicular pain, testicular swelling, dysuria, hematuria or painful bowel movements. He is requesting prophylactic treatment today.  History reviewed. No pertinent past medical history. History reviewed. No pertinent past surgical history. History reviewed. No pertinent family history. Social History  Substance Use Topics  . Smoking status: Current Every Day Smoker -- 0.00 packs/day for 0 years    Types: Cigarettes  . Smokeless tobacco: Never Used  . Alcohol Use: Yes    Review of Systems  All other systems reviewed and are negative.     Allergies  Shrimp  Home Medications   Prior to Admission medications   Medication Sig Start Date End Date Taking? Authorizing Provider  albuterol (PROVENTIL HFA;VENTOLIN HFA) 108 (90 Base) MCG/ACT inhaler Inhale 2 puffs into the lungs every 4 (four) hours as needed for wheezing or shortness of breath. 02/16/16   Shon Batonourtney F Horton, MD  ibuprofen (ADVIL,MOTRIN) 600 MG tablet Take 1 tablet (600 mg total) by mouth every 6 (six) hours as needed. Patient not taking: Reported on 03/30/2016 02/16/16   Shon Batonourtney F Horton, MD  predniSONE (DELTASONE) 20 MG tablet Take 3 tablets (60 mg total) by mouth daily with breakfast. Patient not taking: Reported on 03/30/2016 02/16/16   Shon Batonourtney F Horton, MD   BP 109/66 mmHg  Pulse 87  Temp(Src) 99.3 F (37.4 C) (Oral)  Resp 16  Ht 6' (1.829 m)  Wt 81.647 kg  BMI 24.41 kg/m2  SpO2 100% Physical Exam  Constitutional:  He appears well-developed and well-nourished. No distress.  Nontoxic-appearing  HENT:  Head: Normocephalic and atraumatic.  Right Ear: External ear normal.  Left Ear: External ear normal.  Eyes: Conjunctivae are normal. Right eye exhibits no discharge. Left eye exhibits no discharge. No scleral icterus.  Neck: Normal range of motion.  Cardiovascular: Normal rate.   Pulmonary/Chest: Effort normal.  Abdominal: Soft. He exhibits no distension. There is no tenderness.  Genitourinary: Testes normal. Right testis shows no swelling and no tenderness. Left testis shows no swelling and no tenderness. Circumcised. Discharge found.  Exam chaperoned by med tech. Small amount of yellow penile discharge noted. No tenderness to palpation of the penis. No testicular pain or swelling. No rashes.  Musculoskeletal: Normal range of motion.  Moves all extremities spontaneously  Lymphadenopathy:       Right: No inguinal adenopathy present.       Left: No inguinal adenopathy present.  Neurological: He is alert. Coordination normal.  Skin: Skin is warm and dry.  Psychiatric: He has a normal mood and affect. His behavior is normal.  Nursing note and vitals reviewed.   ED Course  Procedures (including critical care time) Labs Review Labs Reviewed  URINALYSIS, ROUTINE W REFLEX MICROSCOPIC (NOT AT [facility])  GC/CHLAMYDIA PROBE AMP () NOT AT [facility]    Imaging Review No results found. I have personally reviewed and evaluated these images and lab results as part of my medical decision-making.   EKG Interpretation None  MDM   Final diagnoses:  Concern about STD in male without diagnosis   Patient presenting with penile discharge. Patient is afebrile without abdominal tenderness, abdominal pain or painful bowel movements to indicate prostatitis.  No tenderness to palpation of the testes or epididymis to suggest orchitis or epididymitis. STD cultures obtained including gonorrhea and chlamydia.  Patient has been treated prophylactically with azithromycin and Rocephin. Patient to be discharged with instructions to follow up with PCP. Discussed importance of using protection when sexually active. Pt understands that they have GC/Chlamydia cultures pending and that they will need to inform all sexual partners if results return positive. Return precautions given in discharge paperwork and discussed with pt at bedside. Pt stable for discharge      Alveta Heimlich, PA-C 04/15/16 1753  Benjiman Core, MD 04/15/16 2326

## 2016-04-15 NOTE — ED Notes (Signed)
Pt verbalized understanding of d/c instructions and has no further questions. Pt stable and NAD. Pt informed about result process.

## 2016-04-15 NOTE — Discharge Instructions (Signed)
Sexually Transmitted Disease °A sexually transmitted disease (STD) is a disease or infection that may be passed (transmitted) from person to person, usually during sexual activity. This may happen by way of saliva, semen, blood, vaginal mucus, or urine. Common STDs include: °· Gonorrhea. °· Chlamydia. °· Syphilis. °· HIV and AIDS. °· Genital herpes. °· Hepatitis B and C. °· Trichomonas. °· Human papillomavirus (HPV). °· Pubic lice. °· Scabies. °· Mites. °· Bacterial vaginosis. °WHAT ARE CAUSES OF STDs? °An STD may be caused by bacteria, a virus, or parasites. STDs are often transmitted during sexual activity if one person is infected. However, they may also be transmitted through nonsexual means. STDs may be transmitted after:  °· Sexual intercourse with an infected person. °· Sharing sex toys with an infected person. °· Sharing needles with an infected person or using unclean piercing or tattoo needles. °· Having intimate contact with the genitals, mouth, or rectal areas of an infected person. °· Exposure to infected fluids during birth. °WHAT ARE THE SIGNS AND SYMPTOMS OF STDs? °Different STDs have different symptoms. Some people may not have any symptoms. If symptoms are present, they may include: °· Painful or bloody urination. °· Pain in the pelvis, abdomen, vagina, anus, throat, or eyes. °· A skin rash, itching, or irritation. °· Growths, ulcerations, blisters, or sores in the genital and anal areas. °· Abnormal vaginal discharge with or without bad odor. °· Penile discharge in men. °· Fever. °· Pain or bleeding during sexual intercourse. °· Swollen glands in the groin area. °· Yellow skin and eyes (jaundice). This is seen with hepatitis. °· Swollen testicles. °· Infertility. °· Sores and blisters in the mouth. °HOW ARE STDs DIAGNOSED? °To make a diagnosis, your health care provider may: °· Take a medical history. °· Perform a physical exam. °· Take a sample of any discharge to examine. °· Swab the throat,  cervix, opening to the penis, rectum, or vagina for testing. °· Test a sample of your first morning urine. °· Perform blood tests. °· Perform a Pap test, if this applies. °· Perform a colposcopy. °· Perform a laparoscopy. °HOW ARE STDs TREATED? °Treatment depends on the STD. Some STDs may be treated but not cured. °· Chlamydia, gonorrhea, trichomonas, and syphilis can be cured with antibiotic medicine. °· Genital herpes, hepatitis, and HIV can be treated, but not cured, with prescribed medicines. The medicines lessen symptoms. °· Genital warts from HPV can be treated with medicine or by freezing, burning (electrocautery), or surgery. Warts may come back. °· HPV cannot be cured with medicine or surgery. However, abnormal areas may be removed from the cervix, vagina, or vulva. °· If your diagnosis is confirmed, your recent sexual partners need treatment. This is true even if they are symptom-free or have a negative culture or evaluation. They should not have sex until their health care providers say it is okay. °· Your health care provider may test you for infection again 3 months after treatment. °HOW CAN I REDUCE MY RISK OF GETTING AN STD? °Take these steps to reduce your risk of getting an STD: °· Use latex condoms, dental dams, and water-soluble lubricants during sexual activity. Do not use petroleum jelly or oils. °· Avoid having multiple sex partners. °· Do not have sex with someone who has other sex partners °· Do not have sex with anyone you do not know or who is at high risk for an STD. °· Avoid risky sex practices that can break your skin. °· Do not have sex   if you have open sores on your mouth or skin. °· Avoid drinking too much alcohol or taking illegal drugs. Alcohol and drugs can affect your judgment and put you in a vulnerable position. °· Avoid engaging in oral and anal sex acts. °· Get vaccinated for HPV and hepatitis. If you have not received these vaccines in the past, talk to your health care  provider about whether one or both might be right for you. °· If you are at risk of being infected with HIV, it is recommended that you take a prescription medicine daily to prevent HIV infection. This is called pre-exposure prophylaxis (PrEP). You are considered at risk if: °¨ You are a man who has sex with other men (MSM). °¨ You are a heterosexual man or woman and are sexually active with more than one partner. °¨ You take drugs by injection. °¨ You are sexually active with a partner who has HIV. °· Talk with your health care provider about whether you are at high risk of being infected with HIV. If you choose to begin PrEP, you should first be tested for HIV. You should then be tested every 3 months for as long as you are taking PrEP. °WHAT SHOULD I DO IF I THINK I HAVE AN STD? °· See your health care provider. °· Tell your sexual partner(s). They should be tested and treated for any STDs. °· Do not have sex until your health care provider says it is okay. °WHEN SHOULD I GET IMMEDIATE MEDICAL CARE? °Contact your health care provider right away if:  °· You have severe abdominal pain. °· You are a man and notice swelling or pain in your testicles. °· You are a woman and notice swelling or pain in your vagina. °  °This information is not intended to replace advice given to you by your health care provider. Make sure you discuss any questions you have with your health care provider. °  °Document Released: 01/10/2003 Document Revised: 11/10/2014 Document Reviewed: 05/10/2013 °[employer]vier Interactive Patient Education ©2016 [employer] ° °

## 2016-04-16 LAB — GC/CHLAMYDIA PROBE AMP (~~LOC~~) NOT AT ARMC
CHLAMYDIA, DNA PROBE: NEGATIVE
NEISSERIA GONORRHEA: POSITIVE — AB

## 2016-04-17 ENCOUNTER — Telehealth (HOSPITAL_BASED_OUTPATIENT_CLINIC_OR_DEPARTMENT_OTHER): Payer: Self-pay | Admitting: Emergency Medicine

## 2016-05-25 ENCOUNTER — Emergency Department (HOSPITAL_COMMUNITY)
Admission: EM | Admit: 2016-05-25 | Discharge: 2016-05-25 | Disposition: A | Discharge status: Home / Self Care | Payer: Medicaid Other | Attending: Emergency Medicine | Admitting: Emergency Medicine

## 2016-05-25 ENCOUNTER — Encounter (HOSPITAL_COMMUNITY): Payer: Self-pay

## 2016-05-25 DIAGNOSIS — Z79899 Other long term (current) drug therapy: Secondary | ICD-10-CM | POA: Insufficient documentation

## 2016-05-25 DIAGNOSIS — R369 Urethral discharge, unspecified: Secondary | ICD-10-CM | POA: Insufficient documentation

## 2016-05-25 DIAGNOSIS — F17210 Nicotine dependence, cigarettes, uncomplicated: Secondary | ICD-10-CM | POA: Insufficient documentation

## 2016-05-25 LAB — URINALYSIS, ROUTINE W REFLEX MICROSCOPIC
Bilirubin Urine: NEGATIVE
GLUCOSE, UA: NEGATIVE mg/dL
HGB URINE DIPSTICK: NEGATIVE
Ketones, ur: NEGATIVE mg/dL
Nitrite: NEGATIVE
PROTEIN: NEGATIVE mg/dL
Specific Gravity, Urine: 1.02 (ref 1.005–1.030)
pH: 7 (ref 5.0–8.0)

## 2016-05-25 LAB — RAPID HIV SCREEN (HIV 1/2 AB+AG)
HIV 1/2 ANTIBODIES: NONREACTIVE
HIV-1 P24 Antigen - HIV24: NONREACTIVE

## 2016-05-25 LAB — URINE MICROSCOPIC-ADD ON
BACTERIA UA: NONE SEEN
RBC / HPF: NONE SEEN RBC/hpf (ref 0–5)
Squamous Epithelial / LPF: NONE SEEN

## 2016-05-25 MED ORDER — CEFTRIAXONE SODIUM 250 MG IJ SOLR
250.0000 mg | Freq: Once | INTRAMUSCULAR | Status: AC
  Administered 2016-05-25: 250 mg via INTRAMUSCULAR
  Filled 2016-05-25: qty 250

## 2016-05-25 MED ORDER — LIDOCAINE HCL (PF) 1 % IJ SOLN
5.0000 mL | Freq: Once | INTRAMUSCULAR | Status: AC
  Administered 2016-05-25: 0.9 mL
  Filled 2016-05-25: qty 5

## 2016-05-25 MED ORDER — AZITHROMYCIN 250 MG PO TABS
1000.0000 mg | ORAL_TABLET | Freq: Once | ORAL | Status: AC
  Administered 2016-05-25: 1000 mg via ORAL
  Filled 2016-05-25: qty 4

## 2016-05-25 NOTE — ED Notes (Signed)
States has to leave d/t needs to pick up child at 1730.

## 2016-05-25 NOTE — ED Provider Notes (Signed)
[facility]PT Provider Note   CSN: [id number] Arrival date & time: 05/25/16  1417  First Provider Contact:  05/25/2016 4:23 PM   By signing my name below, I, [facility], attest that this documentation has been prepared under the direction and in the presence of Fayrene Helper, PA-C. Electronically Signed: Randell Patient, ED Scribe. 05/25/16. 4:23 PM.  History   Chief Complaint Chief Complaint  Patient presents with  . Penile Discharge    HPI Jerome Simpson is a 20 y.o. male complaining of intermittent, mild, penile discharge onset 2 days ago. Pt states that over the past couple of days he has noted dried discharge on his underwear and mild discomfort to his penis. He notes that he is currently sexually active with one new partner (2 other partners in the past 6 months). Per pt, he has a hx of chlamydia and gonorrhea. Denies abdominal pain, testicular pain, scrotal swelling, dysuria, hematuria, fever, or chills. No specific treatment tried.  The history is provided by the patient. No language interpreter was used.    History reviewed. No pertinent past medical history.  There are no active problems to display for this patient.   History reviewed. No pertinent surgical history.     Home Medications    Prior to Admission medications   Medication Sig Start Date End Date Taking? Authorizing Provider  albuterol (PROVENTIL HFA;VENTOLIN HFA) 108 (90 Base) MCG/ACT inhaler Inhale 2 puffs into the lungs every 4 (four) hours as needed for wheezing or shortness of breath. 02/16/16   Shon Baton, MD  ibuprofen (ADVIL,MOTRIN) 600 MG tablet Take 1 tablet (600 mg total) by mouth every 6 (six) hours as needed. Patient not taking: Reported on 03/30/2016 02/16/16   Shon Baton, MD  predniSONE (DELTASONE) 20 MG tablet Take 3 tablets (60 mg total) by mouth daily with breakfast. Patient not taking: Reported on 03/30/2016 02/16/16   Shon Baton, MD    Family  History No family history on file.  Social History Social History  Substance Use Topics  . Smoking status: Current Every Day Smoker    Packs/day: 1.00    Years: 0.00    Types: Cigarettes  . Smokeless tobacco: Never Used  . Alcohol use Yes     Allergies   Shrimp [shellfish allergy]   Review of Systems Review of Systems  Gastrointestinal: Negative for abdominal pain.  Genitourinary: Positive for discharge and penile pain. Negative for dysuria, hematuria, scrotal swelling and testicular pain.     Physical Exam Updated Vital Signs BP 118/66 (BP Location: Left Arm)   Pulse (!) 56   Temp 98.3 F (36.8 C) (Oral)   Resp 20   SpO2 99%   Physical Exam  Constitutional: He is oriented to person, place, and time. He appears well-developed and well-nourished. No distress.  HENT:  Head: Normocephalic and atraumatic.  Eyes: Conjunctivae are normal.  Neck: Normal range of motion.  Cardiovascular: Normal rate.   Pulmonary/Chest: Effort normal. No respiratory distress.  Genitourinary: Circumcised.  Genitourinary Comments: Chaperone present. Circumcised male. Mild bilateral inguinal lymphadenopathy. Small amount discharge noted at the tip of penis. Scrotum of normal size. Perineum is soft. Testicle with normal lie.  Musculoskeletal: Normal range of motion.  Neurological: He is alert and oriented to person, place, and time.  Skin: Skin is warm and dry.  Psychiatric: He has a normal mood and affect. His behavior is normal.  Nursing note and vitals reviewed.    ED Treatments / Results   DIAGNOSTIC  STUDIES: Oxygen Saturation is 99% on RA, normal by my interpretation.    COORDINATION OF CARE: 4:14 PM Will order labs. Will prescribe antibiotics. Will discharge pt. Discussed treatment plan with pt at bedside and pt agreed to plan.   Radiology No results found.  Procedures Procedures (including critical care time)  Medications Ordered in ED Medications - No data to  display   Initial Impression / Assessment and Plan / ED Course  I have reviewed the triage vital signs and the nursing notes.  Pertinent labs & imaging results that were available during my care of the patient were reviewed by me and considered in my medical decision making (see chart for details).  Clinical Course    BP 118/66 (BP Location: Left Arm)   Pulse (!) 56   Temp 98.3 F (36.8 C) (Oral)   Resp 20   SpO2 99%     Final Clinical Impressions(s) / ED Diagnoses   Final diagnoses:  Urethral discharge in male    New Prescriptions New Prescriptions   No medications on file    I personally performed the services described in this documentation, which was scribed in my presence. The recorded information has been reviewed and is accurate.   5:24 PM Penile discharge.  Will treat for suspect STD with rocephin/zithromax.  Pt will be notified if his test result positive for any specific disease.  At the time of pt leaving, UA have not resulted yet.  Pt request to leave and to be notified if test result shows up positive for trich or other std.     Fayrene Helper, PA-C 05/25/16 1726    Leta Baptist, MD 05/26/16 [phone]

## 2016-05-25 NOTE — ED Triage Notes (Signed)
Per pt, Pt started to have a clear penile discharge that started today. Pt denies any burning with urination or blood in urine.

## 2016-05-26 LAB — RPR: RPR Ser Ql: NONREACTIVE

## 2016-05-26 LAB — GC/CHLAMYDIA PROBE AMP (~~LOC~~) NOT AT ARMC
Chlamydia: NEGATIVE
NEISSERIA GONORRHEA: POSITIVE — AB

## 2016-05-27 ENCOUNTER — Telehealth (HOSPITAL_BASED_OUTPATIENT_CLINIC_OR_DEPARTMENT_OTHER): Payer: Self-pay | Admitting: Emergency Medicine

## 2016-05-27 ENCOUNTER — Telehealth (HOSPITAL_COMMUNITY): Payer: Self-pay

## 2016-05-27 NOTE — Telephone Encounter (Signed)
Positive for gonorrhea. Treated per protocol. [employer] form faxed. Attempting to contact. Letter sent to address on file

## 2016-06-04 ENCOUNTER — Encounter (HOSPITAL_COMMUNITY): Payer: Self-pay | Admitting: *Deleted

## 2016-06-04 ENCOUNTER — Emergency Department (HOSPITAL_COMMUNITY)
Admission: EM | Admit: 2016-06-04 | Discharge: 2016-06-04 | Disposition: A | Discharge status: Home / Self Care | Payer: Medicaid Other | Attending: Emergency Medicine | Admitting: Emergency Medicine

## 2016-06-04 DIAGNOSIS — Z202 Contact with and (suspected) exposure to infections with a predominantly sexual mode of transmission: Secondary | ICD-10-CM | POA: Diagnosis present

## 2016-06-04 DIAGNOSIS — F17210 Nicotine dependence, cigarettes, uncomplicated: Secondary | ICD-10-CM | POA: Diagnosis not present

## 2016-06-04 LAB — URINALYSIS, ROUTINE W REFLEX MICROSCOPIC
Bilirubin Urine: NEGATIVE
GLUCOSE, UA: NEGATIVE mg/dL
HGB URINE DIPSTICK: NEGATIVE
KETONES UR: NEGATIVE mg/dL
Nitrite: NEGATIVE
PROTEIN: NEGATIVE mg/dL
Specific Gravity, Urine: 1.009 (ref 1.005–1.030)
pH: 8 (ref 5.0–8.0)

## 2016-06-04 LAB — URINE MICROSCOPIC-ADD ON: RBC / HPF: NONE SEEN RBC/hpf (ref 0–5)

## 2016-06-04 MED ORDER — AZITHROMYCIN 250 MG PO TABS
1000.0000 mg | ORAL_TABLET | Freq: Once | ORAL | Status: AC
  Administered 2016-06-04: 1000 mg via ORAL
  Filled 2016-06-04: qty 4

## 2016-06-04 MED ORDER — CEFTRIAXONE SODIUM 250 MG IJ SOLR
250.0000 mg | Freq: Once | INTRAMUSCULAR | Status: AC
  Administered 2016-06-04: 250 mg via INTRAMUSCULAR
  Filled 2016-06-04: qty 250

## 2016-06-04 MED ORDER — STERILE WATER FOR INJECTION IJ SOLN
INTRAMUSCULAR | Status: AC
  Administered 2016-06-04: 10 mL
  Filled 2016-06-04: qty 10

## 2016-06-04 NOTE — ED Notes (Signed)
Declined W/C at D/C and was escorted to lobby by RN. 

## 2016-06-04 NOTE — Discharge Instructions (Signed)
Patient instructed to abstain from sexual intercourse for 10-14 days. Advised to use protection. Please return if symptoms worsen or fail improve. Please advise sexual partners to be tested.

## 2016-06-04 NOTE — ED Triage Notes (Signed)
Pt reports being treated for gonorrhea one week ago and still having symptoms.

## 2016-06-04 NOTE — ED Provider Notes (Signed)
[facility] Provider Note   CSN: [id number] Arrival date & time: 06/04/16  1332  First Provider Contact:  First MD Initiated Contact with Patient 06/04/16 1615        History   Chief Complaint Chief Complaint  Patient presents with  . SEXUALLY TRANSMITTED DISEASE    HPI Ralston Venus is a 20 y.o. male.  Deral Schellenberg is a 20 yo male with history of Ghonorrhea/chlamydia that presents today with continued penile discharge and burning. Patient was seen 7/23 at [facility] ER for the same symptoms. He was diagnosed with Gonorrhea and treated with Rocephin and azithromycin. Patient was told to abstain from sexual activity for 10 days. Patient has sexual intercourse 5 days after being treated with the same partner. Patient states his partner was treated for STD as well.    He states that his penile discharge had improved but has since returned since have intercourse. He is sexually active with the same partner. He endorses mild burning with urination but has improved since being treated. Denies any fever, chills, n/v/d, abd pain, testicular pain, scrotal swelling, hematuria.   Patient states that he does not always use protection.    The history is provided by the patient.    History reviewed. No pertinent past medical history.  There are no active problems to display for this patient.   History reviewed. No pertinent surgical history.     Home Medications    Prior to Admission medications   Medication Sig Start Date End Date Taking? Authorizing Provider  albuterol (PROVENTIL HFA;VENTOLIN HFA) 108 (90 Base) MCG/ACT inhaler Inhale 2 puffs into the lungs every 4 (four) hours as needed for wheezing or shortness of breath. 02/16/16   Shon Baton, MD  ibuprofen (ADVIL,MOTRIN) 600 MG tablet Take 1 tablet (600 mg total) by mouth every 6 (six) hours as needed. Patient not taking: Reported on 03/30/2016 02/16/16   Shon Baton, MD  predniSONE (DELTASONE) 20 MG tablet  Take 3 tablets (60 mg total) by mouth daily with breakfast. Patient not taking: Reported on 03/30/2016 02/16/16   Shon Baton, MD    Family History History reviewed. No pertinent family history.  Social History Social History  Substance Use Topics  . Smoking status: Current Every Day Smoker    Packs/day: 1.00    Years: 0.00    Types: Cigarettes  . Smokeless tobacco: Never Used  . Alcohol use Yes     Allergies   Shrimp [shellfish allergy]   Review of Systems Review of Systems  Constitutional: Negative for chills, fatigue and fever.  Respiratory: Negative.   Cardiovascular: Negative.   Gastrointestinal: Negative.   Genitourinary: Positive for discharge and dysuria. Negative for difficulty urinating, flank pain, frequency, genital sores, hematuria, penile pain, penile swelling, scrotal swelling and testicular pain.  Musculoskeletal: Negative.      Physical Exam Updated Vital Signs BP 125/76 (BP Location: Right Arm)   Pulse 100   Temp 98.1 F (36.7 C) (Oral)   Resp 22   SpO2 99%   Physical Exam  Constitutional: He is oriented to person, place, and time. He appears well-developed and well-nourished.  HENT:  Head: Normocephalic and atraumatic.  Eyes: Conjunctivae are normal. Pupils are equal, round, and reactive to light.  Neck: Normal range of motion.  Cardiovascular: Normal rate, regular rhythm, normal heart sounds and intact distal pulses.  Exam reveals no gallop and no friction rub.   No murmur heard. Pulmonary/Chest: Effort normal and breath sounds normal.  Abdominal: Soft. Bowel sounds are normal. He exhibits no distension. There is no tenderness.  Genitourinary: Penis normal.  Genitourinary Comments: There was a chaperone present. Patient is a circumsed male. No discharge appreciated with palpation of glands. Scrotum normal in size. Two descended testes. No inguinal lymphadenopathy appreciated.   Musculoskeletal: Normal range of motion.  Neurological: He  is alert and oriented to person, place, and time.  Skin: Skin is warm and dry.     ED Treatments / Results  Labs (all labs ordered are listed, but only abnormal results are displayed) Labs Reviewed  URINALYSIS, ROUTINE W REFLEX MICROSCOPIC (NOT AT [facility] - Abnormal; Notable for the following:       Result Value   APPearance CLOUDY (*)    Leukocytes, UA TRACE (*)    All other components within normal limits  URINE MICROSCOPIC-ADD ON  GC/CHLAMYDIA PROBE AMP ([place]) NOT AT [facility]    EKG  EKG Interpretation None       Radiology No results found.  Procedures Procedures (including critical care time)  Medications Ordered in ED Medications  cefTRIAXone (ROCEPHIN) injection 250 mg (not administered)  azithromycin (ZITHROMAX) tablet 1,000 mg (not administered)     Initial Impression / Assessment and Plan / ED Course  I have reviewed the triage vital signs and the nursing notes.  Pertinent labs & imaging results that were available during my care of the patient were reviewed by me and considered in my medical decision making (see chart for details).  Clinical Course  Patient interviewed and examined with a chaperone.   UA and GC/chyl probe ordered.  Abx ordered.  Discussed plan with patient.  Final Clinical Impressions(s) / ED Diagnoses   Final diagnoses:  STD exposure   MDM: Patient was treated with abx for gonorrhea and chlymidia 7/23 at [facility] ER. He had re exposure with sexual partner. He was tested for HIV and Syphilis in ER (7/23). Results were negative. Will not re test patient. Patient given abx in ER. Verbalized with patient need to abstain from sexual activity for 10-14 days. Patient will return if symptoms worsen or fail to improve.  New Prescriptions New Prescriptions   No medications on file     Rise Mu, Cordelia Poche 06/04/16 1712    Rolland Porter, MD 06/15/16 1944

## 2016-06-05 LAB — GC/CHLAMYDIA PROBE AMP (~~LOC~~) NOT AT ARMC
Chlamydia: NEGATIVE
Neisseria Gonorrhea: NEGATIVE

## 2016-06-13 ENCOUNTER — Emergency Department (HOSPITAL_COMMUNITY)
Admission: EM | Admit: 2016-06-13 | Discharge: 2016-06-13 | Disposition: A | Discharge status: Home / Self Care | Payer: Medicaid Other | Attending: Emergency Medicine | Admitting: Emergency Medicine

## 2016-06-13 ENCOUNTER — Encounter (HOSPITAL_COMMUNITY): Payer: Self-pay | Admitting: Emergency Medicine

## 2016-06-13 DIAGNOSIS — R369 Urethral discharge, unspecified: Secondary | ICD-10-CM | POA: Diagnosis not present

## 2016-06-13 DIAGNOSIS — F17210 Nicotine dependence, cigarettes, uncomplicated: Secondary | ICD-10-CM | POA: Diagnosis not present

## 2016-06-13 HISTORY — DX: Unspecified sexually transmitted disease: A64

## 2016-06-13 LAB — URINALYSIS, ROUTINE W REFLEX MICROSCOPIC
Bilirubin Urine: NEGATIVE
GLUCOSE, UA: NEGATIVE mg/dL
Hgb urine dipstick: NEGATIVE
Ketones, ur: NEGATIVE mg/dL
LEUKOCYTES UA: NEGATIVE
Nitrite: NEGATIVE
PH: 7.5 (ref 5.0–8.0)
PROTEIN: NEGATIVE mg/dL
SPECIFIC GRAVITY, URINE: 1.016 (ref 1.005–1.030)

## 2016-06-13 LAB — WET PREP, GENITAL
Clue Cells Wet Prep HPF POC: NONE SEEN
Sperm: NONE SEEN
TRICH WET PREP: NONE SEEN
YEAST WET PREP: NONE SEEN

## 2016-06-13 LAB — GC/CHLAMYDIA PROBE AMP (~~LOC~~) NOT AT ARMC
CHLAMYDIA, DNA PROBE: NEGATIVE
NEISSERIA GONORRHEA: NEGATIVE

## 2016-06-13 NOTE — Discharge Instructions (Signed)
Please call the urologist listed to schedule a follow up appointment in regards to today's visit.  Please abstain from all sexual activity for at least 10 days and until your symptoms (and your partner symptoms if applicable) are resolved.  Use a condom with every sexual encounter Please return to the ER for worsening symptoms, high fevers or persistent vomiting.  You have been tested for chlamydia and gonorrhea. These results will be available in approximately 3 days. You will be notified if they are positive.

## 2016-06-13 NOTE — ED Triage Notes (Signed)
Pt. Stated, I just had Gonorrhea 7-8 days ago and Im still having some discharge.

## 2016-06-13 NOTE — ED Provider Notes (Signed)
[facility] Provider Note   CSN: [id number] Arrival date & time: 06/13/16  [id number]  First Provider Contact:  First MD Initiated Contact with Patient 06/13/16 [phone]        History   Chief Complaint Chief Complaint  Patient presents with  . Exposure to STD  . Penile Discharge    HPI Jerome Simpson is a 20 y.o. male.  The history is provided by the patient and medical records. No language interpreter was used.  Exposure to STD  Pertinent negatives include no abdominal pain, no headaches and no shortness of breath.  Penile Discharge  Pertinent negatives include no abdominal pain, no headaches and no shortness of breath.    Past Medical History:  Diagnosis Date  . STD (male)     There are no active problems to display for this patient.   History reviewed. No pertinent surgical history.   Jerome Simpson is a 20 y.o. male  who presents to the Emergency Department complaining of penile discharge and mild dysuria. Patient was seen on 7/23 where he was prophylactically treated for gonorrhea and chlamydia. His G&C from that visit was positive. He was told to abstain from sexual intercourse for 10 days- he had intercourse 5 days after treatment. He states his partner was evaluated and "cleared". He was subsequently seen again on 8/02 because symptoms returned. He was again treated with azithromycin and Rocephin. G&C again obtained however this time test was negative. Patient states he improves for a few days after antibiotics, however symptoms return again. He does not feel as if symptoms ever truly resolved. He denies abdominal pain, nausea, vomiting, diarrhea, back pain, fever, scrotal or penile swelling/tenderness.  Home Medications    Prior to Admission medications   Not on File    Family History History reviewed. No pertinent family history.  Social History Social History  Substance Use Topics  . Smoking status: Current Every Day Smoker    Packs/day: 1.00   Years: 0.00    Types: Cigarettes  . Smokeless tobacco: Current User  . Alcohol use Yes     Allergies   Shrimp [shellfish allergy]   Review of Systems Review of Systems  Constitutional: Negative for chills and fever.  HENT: Negative for congestion.   Eyes: Negative for visual disturbance.  Respiratory: Negative for cough and shortness of breath.   Cardiovascular: Negative.   Gastrointestinal: Negative for abdominal pain, nausea and vomiting.  Genitourinary: Positive for discharge and dysuria. Negative for penile pain, penile swelling, scrotal swelling and testicular pain.  Musculoskeletal: Negative for back pain and neck pain.  Skin: Negative for rash.  Neurological: Negative for headaches.     Physical Exam Updated Vital Signs BP 112/75   Pulse 62   Temp 98 F (36.7 C) (Oral)   Resp 14   Ht 6' (1.829 m)   Wt 79.4 kg   SpO2 99%   BMI 23.73 kg/m   Physical Exam  Constitutional: He is oriented to person, place, and time. He appears well-developed and well-nourished. No distress.  HENT:  Head: Normocephalic and atraumatic.  Cardiovascular: Normal rate, regular rhythm and normal heart sounds.   Pulmonary/Chest: Effort normal and breath sounds normal. No respiratory distress.  Abdominal: Soft. Bowel sounds are normal. He exhibits no distension. There is no tenderness.  Genitourinary:  Genitourinary Comments: Chaperone present for exam. Circumcised male. No signs of lesion or erythema on the penis or testicles. The penis and testicles are nontender. No testicular masses or swelling. No signs of  any inguinal hernias. No signs of any discharge from the penis. Cremaster reflex present bilaterally. Shotty nontender left inguinal lymphadenopathy.  Neurological: He is alert and oriented to person, place, and time.  Skin: Skin is warm and dry.  Nursing note and vitals reviewed.    ED Treatments / Results  Labs (all labs ordered are listed, but only abnormal results are  displayed) Labs Reviewed  WET PREP, GENITAL - Abnormal; Notable for the following:       Result Value   WBC, Wet Prep HPF POC PRESENT (*)    All other components within normal limits  URINALYSIS, ROUTINE W REFLEX MICROSCOPIC (NOT AT [facility])  GC/CHLAMYDIA PROBE AMP ([place]) NOT AT [facility]    EKG  EKG Interpretation None       Radiology No results found.  Procedures Procedures (including critical care time)  Medications Ordered in ED Medications - No data to display   Initial Impression / Assessment and Plan / ED Course  I have reviewed the triage vital signs and the nursing notes.  Pertinent labs & imaging results that were available during my care of the patient were reviewed by me and considered in my medical decision making (see chart for details).  Clinical Course   Jerome Simpson is a 20 y.o. male who presents to ED for dysuria and penile discharge. Patient with + gonorrhea test on both 6/13 and 7/23. Seen in ED for persistent symptoms on 8/02 where he was prophylactically treated for G&C. Results from [facility] testing on 8/02 are negative. UA and repeat G&C obtained today.   Urine unremarkable. Trace leuks but no other signs of infection. Discussed case with attending, Dr. Erma Heritage. Will obtain wet prep-symptoms possibly secondary to trich or yeast.  Wet prep obtained that shows white cells, but otherwise unremarkable. Given patient has been treated 3 times with antibiotics for gonorrhea and last G&C 1 week ago was negative, will wait for results to come back before any further antibiotics given. This was discussed with patient and I also encouraged him to follow up with the urologist (referral information on discharge paperwork) if symptoms are not improving over the next 2-3 days. No intercourse for at least 10 days and until asymptomatic. Patient expressed verbal understanding and agreement with plan and all questions were answered.   Patient discussed with Dr. Erma Heritage who  agrees with treatment plan.   Final Clinical Impressions(s) / ED Diagnoses   Final diagnoses:  Penile discharge    New Prescriptions There are no discharge medications for this patient.    [facility] Mahayla Haddaway, PA-C 06/13/16 1201    Shaune Pollack, MD 06/15/16 [phone]

## 2016-08-30 ENCOUNTER — Encounter (HOSPITAL_COMMUNITY): Payer: Self-pay | Admitting: *Deleted

## 2016-08-30 ENCOUNTER — Emergency Department (HOSPITAL_COMMUNITY)
Admission: EM | Admit: 2016-08-30 | Discharge: 2016-08-31 | Disposition: A | Discharge status: Home / Self Care | Payer: Medicaid Other | Attending: Emergency Medicine | Admitting: Emergency Medicine

## 2016-08-30 DIAGNOSIS — R369 Urethral discharge, unspecified: Secondary | ICD-10-CM | POA: Diagnosis not present

## 2016-08-30 DIAGNOSIS — Z711 Person with feared health complaint in whom no diagnosis is made: Secondary | ICD-10-CM

## 2016-08-30 DIAGNOSIS — F17210 Nicotine dependence, cigarettes, uncomplicated: Secondary | ICD-10-CM | POA: Diagnosis not present

## 2016-08-30 MED ORDER — LIDOCAINE HCL (PF) 1 % IJ SOLN
INTRAMUSCULAR | Status: AC
  Filled 2016-08-30: qty 5

## 2016-08-30 MED ORDER — CEFTRIAXONE SODIUM 250 MG IJ SOLR
250.0000 mg | Freq: Once | INTRAMUSCULAR | Status: AC
  Administered 2016-08-31: 250 mg via INTRAMUSCULAR
  Filled 2016-08-30: qty 250

## 2016-08-30 MED ORDER — AZITHROMYCIN 1 G PO PACK
1.0000 g | PACK | Freq: Once | ORAL | Status: AC
  Administered 2016-08-30: 1 g via ORAL
  Filled 2016-08-30: qty 1

## 2016-08-30 NOTE — ED Triage Notes (Signed)
The pt is c/o of discharge from his penis since yesterday and the tip of his penis is irritated by his underware

## 2016-08-30 NOTE — ED Provider Notes (Signed)
[facility]PT Provider Note   CSN: [id number] Arrival date & time: 08/30/16  2159  By signing my name below, I, Jerome Simpson, attest that this documentation has been prepared under the direction and in the presence of Jerome MarlinJessica Ladye Macnaughton, PA-C.  Electronically Signed: Rosario AdieWilliam Andrew Simpson, ED Scribe. 08/30/16. 11:31 PM.  History   Chief Complaint Chief Complaint  Patient presents with  . Penile Discharge   The history is provided by the patient. No language interpreter was used.   HPI Comments: Jerome Simpson is a 20 y.o. male who presents to the Emergency Department complaining of intermittent penile discharge which began ~1-2 days ago. He reports associated mild dysuria with urine voids and mild pain to the tip of the penis secondary to the onset of his discharge. Pt has been dx'd with Gonorrhea in the past which he states is similar to his symptoms today. Pt has had multiple sexual partners over the past ~6 months and he states that he does not use barrier protection each time. His penile irritation is mildly exacerbated with wearing undergarments. Denies abdominal pain, fevers, nausea, vomiting, testicular swelling, testicular pain, or any other associated symptoms    Past Medical History:  Diagnosis Date  . STD (male)    There are no active problems to display for this patient.  History reviewed. No pertinent surgical history.  Home Medications    Prior to Admission medications   Not on File   Family History No family history on file.  Social History Social History  Substance Use Topics  . Smoking status: Current Every Day Smoker    Packs/day: 1.00    Years: 0.00    Types: Cigarettes  . Smokeless tobacco: Current User  . Alcohol use Yes   Allergies   Shrimp [shellfish allergy]  Review of Systems Review of Systems  Constitutional: Negative for fever.  Gastrointestinal: Negative for abdominal pain, nausea and vomiting.  Genitourinary: Positive for  discharge and penile pain. Negative for penile swelling, scrotal swelling and testicular pain.  All other systems reviewed and are negative.  Physical Exam Updated Vital Signs BP 113/76 (BP Location: Right Arm)   Pulse 85   Temp 98.6 F (37 C) (Oral)   Resp 16   Ht 6' (1.829 m)   Wt 181 lb (82.1 kg)   SpO2 99%   BMI 24.55 kg/m   Physical Exam  Constitutional: He appears well-developed and well-nourished. No distress.  HENT:  Head: Normocephalic and atraumatic.  Eyes: Conjunctivae are normal.  Pulmonary/Chest: Effort normal. No respiratory distress.  Genitourinary: Testes normal. Right testis shows no mass, no swelling and no tenderness. Left testis shows no mass, no swelling and no tenderness. Circumcised. No phimosis, paraphimosis, hypospadias or penile erythema. Discharge found.  Genitourinary Comments: Chaperone present throughout entire exam. Irritation to the urethral opening and yellow, purulent discharge noted.      Musculoskeletal: Normal range of motion.  Neurological: He is alert. Coordination normal.  Skin: Skin is warm and dry. He is not diaphoretic.  Psychiatric: He has a normal mood and affect. His behavior is normal.  Nursing note and vitals reviewed.  ED Treatments / Results  DIAGNOSTIC STUDIES: Oxygen Saturation is 99% on RA, normal by my interpretation.   COORDINATION OF CARE: 11:31 PM-Discussed next steps with pt. Pt verbalized understanding and is agreeable with the plan.   Labs (all labs ordered are listed, but only abnormal results are displayed) Labs Reviewed  URINALYSIS, ROUTINE W REFLEX MICROSCOPIC (NOT AT [facility]) -  Abnormal; Notable for the following:       Result Value   APPearance CLOUDY (*)    Hgb urine dipstick SMALL (*)    Leukocytes, UA LARGE (*)    All other components within normal limits  URINE MICROSCOPIC-ADD ON - Abnormal; Notable for the following:    Squamous Epithelial / LPF 0-5 (*)    Bacteria, UA MANY (*)    All other  components within normal limits  RPR  HIV ANTIBODY (ROUTINE TESTING)  GC/CHLAMYDIA PROBE AMP ([place]) NOT [facility]    EKG  EKG Interpretation None      Radiology No results found.  Procedures Procedures   Medications Ordered in ED Medications  cefTRIAXone (ROCEPHIN) injection 250 mg (250 mg Intramuscular Given 08/31/16 0002)  azithromycin (ZITHROMAX) powder 1 g (1 g Oral Given 08/30/16 2353)    Initial Impression / Assessment and Plan / ED Course  I have reviewed the triage vital signs and the nursing notes.  Pertinent labs & imaging results that were available during my care of the patient were reviewed by me and considered in my medical decision making (see chart for details).  Clinical Course   Patient treated in the ED for STI with Rocephin and Zithromax. Patient advised to inform and treat all sexual partners.  Pt advised on safe sex practices and understands that they have GC/Chlamydia cultures pending and will result in 2-3 days. HIV and RPR sent. Pt encouraged to follow up at local health department for future STI checks. No concern for prostatitis or epididymitis. Discussed return precautions. Pt appears safe for discharge And expressed understanding to the discharge instructions..   Final Clinical Impressions(s) / ED Diagnoses   Final diagnoses:  Penile discharge  Concern about STD in male without diagnosis   New Prescriptions There are no discharge medications for this patient.  I personally performed the services described in this documentation, which was scribed in my presence. The recorded information has been reviewed and is accurate.       Jerome SimonJessica L Yossef Gilkison, PA 08/31/16 [id number]    Gerhard Munchobert Lockwood, MD 08/31/16 [phone]

## 2016-08-31 LAB — URINE MICROSCOPIC-ADD ON

## 2016-08-31 LAB — URINALYSIS, ROUTINE W REFLEX MICROSCOPIC
BILIRUBIN URINE: NEGATIVE
Glucose, UA: NEGATIVE mg/dL
Ketones, ur: NEGATIVE mg/dL
NITRITE: NEGATIVE
PH: 6.5 (ref 5.0–8.0)
Protein, ur: NEGATIVE mg/dL
SPECIFIC GRAVITY, URINE: 1.007 (ref 1.005–1.030)

## 2016-08-31 LAB — RPR: RPR Ser Ql: NONREACTIVE

## 2016-08-31 LAB — HIV ANTIBODY (ROUTINE TESTING W REFLEX): HIV Screen 4th Generation wRfx: NONREACTIVE

## 2016-08-31 NOTE — Discharge Instructions (Signed)
Inform all your sexual partners that you have been treated for gonorrhea and chlamydia as they will need to be treated also. Follow-up with the [facility] in 3 days if symptoms are not improving. [facility] for future STD testing. Your gonorrhea, chlamydia, HIV, syphilis tests are still pending. If anything is positive you will receive a phone call. Return immediately to the emergency department if you experience worsening discharge, pain or swelling of your scrotum, fevers, abdominal pain, nausea, vomiting, back pain or any other concerning symptoms.

## 2016-09-01 LAB — GC/CHLAMYDIA PROBE AMP (~~LOC~~) NOT AT ARMC
Chlamydia: POSITIVE — AB
Neisseria Gonorrhea: POSITIVE — AB

## 2016-09-02 NOTE — ED Notes (Signed)
Patient called at [phone] lab results given and patient was treated with appropriate medication, form faxed to health department and patient education given.

## 2016-11-23 ENCOUNTER — Encounter (HOSPITAL_COMMUNITY): Payer: Self-pay

## 2016-11-23 ENCOUNTER — Emergency Department (HOSPITAL_COMMUNITY)
Admission: EM | Admit: 2016-11-23 | Discharge: 2016-11-24 | Disposition: A | Discharge status: Home / Self Care | Payer: Medicaid Other | Attending: Emergency Medicine | Admitting: Emergency Medicine

## 2016-11-23 DIAGNOSIS — R05 Cough: Secondary | ICD-10-CM | POA: Insufficient documentation

## 2016-11-23 DIAGNOSIS — R69 Illness, unspecified: Secondary | ICD-10-CM

## 2016-11-23 DIAGNOSIS — R509 Fever, unspecified: Secondary | ICD-10-CM | POA: Insufficient documentation

## 2016-11-23 DIAGNOSIS — F17210 Nicotine dependence, cigarettes, uncomplicated: Secondary | ICD-10-CM | POA: Insufficient documentation

## 2016-11-23 DIAGNOSIS — J111 Influenza due to unidentified influenza virus with other respiratory manifestations: Secondary | ICD-10-CM

## 2016-11-23 NOTE — ED Triage Notes (Signed)
Pt complaining of generalized body aches, cough and fever x 2 days. Pt states coughing up yellow sputum. Pt afebrile at triage.

## 2016-11-24 MED ORDER — ACETAMINOPHEN 500 MG PO TABS
1000.0000 mg | ORAL_TABLET | Freq: Once | ORAL | Status: AC
  Administered 2016-11-24: 1000 mg via ORAL
  Filled 2016-11-24: qty 2

## 2016-11-24 MED ORDER — BENZONATATE 100 MG PO CAPS
100.0000 mg | ORAL_CAPSULE | Freq: Once | ORAL | Status: AC
  Administered 2016-11-24: 100 mg via ORAL
  Filled 2016-11-24: qty 1

## 2016-11-24 MED ORDER — OSELTAMIVIR PHOSPHATE 75 MG PO CAPS: 75.0000 mg | ORAL_CAPSULE | Freq: Two times a day (BID) | ORAL | 0 refills | Status: DC

## 2016-11-24 MED ORDER — BENZONATATE 100 MG PO CAPS: 200.0000 mg | ORAL_CAPSULE | Freq: Two times a day (BID) | ORAL | 0 refills | Status: DC | PRN

## 2016-11-24 NOTE — ED Provider Notes (Signed)
[facility] Provider Note   CSN: [id number] Arrival date & time: 11/23/16  2325     History   Chief Complaint Chief Complaint  Patient presents with  . Generalized Body Aches  . Cough    HPI Jerome Simpson is a 21 y.o. male.  Patient presents to the emergency department with chief complaint of flulike symptoms. He states that since yesterday, he has had fevers, chills, cough, body aches, and sore throat. He has not taken anything for her symptoms. He states that his friends told him to come to the emergency department to be evaluated. He denies any history of asthma or COPD. Denies any other medical problems. He denies any other associated symptoms. He did not get a flu shot this year.   The history is provided by the patient. No language interpreter was used.    Past Medical History:  Diagnosis Date  . STD (male)     There are no active problems to display for this patient.   History reviewed. No pertinent surgical history.     Home Medications    Prior to Admission medications   Not on File    Family History History reviewed. No pertinent family history.  Social History Social History  Substance Use Topics  . Smoking status: Current Every Day Smoker    Packs/day: 1.00    Years: 0.00    Types: Cigarettes  . Smokeless tobacco: Current User  . Alcohol use Yes     Allergies   Shrimp [shellfish allergy]   Review of Systems Review of Systems  Constitutional: Positive for chills and fever.  HENT: Positive for postnasal drip, rhinorrhea, sinus pressure, sneezing and sore throat.   Respiratory: Positive for cough. Negative for shortness of breath.   Cardiovascular: Negative for chest pain.  Gastrointestinal: Negative for abdominal pain, constipation, diarrhea, nausea and vomiting.  Genitourinary: Negative for dysuria.     Physical Exam Updated Vital Signs BP 126/83   Pulse 115   Temp 99.1 F (37.3 C)   Resp 16   SpO2 98%   Physical  Exam Physical Exam  Constitutional: Pt  is oriented to person, place, and time. Appears well-developed and well-nourished. No distress.  HENT:  Head: Normocephalic and atraumatic.  Right Ear: Tympanic membrane, external ear and ear canal normal.  Left Ear: Tympanic membrane, external ear and ear canal normal.  Nose: Mucosal edema and moderate rhinorrhea present. No epistaxis. Right sinus exhibits no maxillary sinus tenderness and no frontal sinus tenderness. Left sinus exhibits no maxillary sinus tenderness and no frontal sinus tenderness.  Mouth/Throat: Uvula is midline and mucous membranes are normal. Mucous membranes are not pale and not cyanotic. No oropharyngeal exudate, posterior oropharyngeal edema, posterior oropharyngeal erythema or tonsillar abscesses.  Eyes: Conjunctivae are normal. Pupils are equal, round, and reactive to light.  Neck: Normal range of motion and full passive range of motion without pain.  Cardiovascular: Normal rate and intact distal pulses.   Pulmonary/Chest: Effort normal and breath sounds normal. No stridor.  Clear and equal breath sounds without focal wheezes, rhonchi, rales  Abdominal: Soft. Bowel sounds are normal. There is no tenderness.  Musculoskeletal: Normal range of motion.  Lymphadenopathy:    Pthas no cervical adenopathy.  Neurological: Pt is alert and oriented to person, place, and time.  Skin: Skin is warm and dry. No rash noted. Pt is not diaphoretic.  Psychiatric: Normal mood and affect.  Nursing note and vitals reviewed.    ED Treatments / Results  Labs (  all labs ordered are listed, but only abnormal results are displayed) Labs Reviewed - No data to display  EKG  EKG Interpretation None       Radiology No results found.  Procedures Procedures (including critical care time)  Medications Ordered in ED Medications  acetaminophen (TYLENOL) tablet 1,000 mg (not administered)  benzonatate (TESSALON) capsule 100 mg (not  administered)     Initial Impression / Assessment and Plan / ED Course  I have reviewed the triage vital signs and the nursing notes.  Pertinent labs & imaging results that were available during my care of the patient were reviewed by me and considered in my medical decision making (see chart for details).     Patient with symptoms consistent with influenza.  Vitals are stable, low-grade fever.  No signs of dehydration, tolerating PO's.  Lungs are clear. Due to patient's presentation and physical exam a chest x-ray was not ordered bc likely diagnosis of flu.  Discussed Tamiflu. Patient will be discharged with instructions to orally hydrate, rest, and use over-the-counter medications such as anti-inflammatories ibuprofen and Aleve for muscle aches and Tylenol for fever.  Patient will also be given a cough suppressant.    Final Clinical Impressions(s) / ED Diagnoses   Final diagnoses:  Influenza-like illness    New Prescriptions New Prescriptions   BENZONATATE (TESSALON) 100 MG CAPSULE    Take 2 capsules (200 mg total) by mouth 2 (two) times daily as needed for cough.   OSELTAMIVIR (TAMIFLU) 75 MG CAPSULE    Take 1 capsule (75 mg total) by mouth every 12 (twelve) hours.     Roxy Horseman, PA-C 11/24/16 8119    Shon Baton, MD 11/24/16 [phone]

## 2016-11-24 NOTE — ED Notes (Signed)
Pt verbalized understanding of flu and related precautions. NAD. Ambulatory to waiting area 

## 2017-04-16 ENCOUNTER — Encounter (HOSPITAL_COMMUNITY): Payer: Self-pay | Admitting: Emergency Medicine

## 2017-04-16 ENCOUNTER — Emergency Department (HOSPITAL_COMMUNITY)
Admission: EM | Admit: 2017-04-16 | Discharge: 2017-04-16 | Disposition: A | Discharge status: Home / Self Care | Payer: Medicaid Other | Attending: Emergency Medicine | Admitting: Emergency Medicine

## 2017-04-16 DIAGNOSIS — Z202 Contact with and (suspected) exposure to infections with a predominantly sexual mode of transmission: Secondary | ICD-10-CM

## 2017-04-16 DIAGNOSIS — F17210 Nicotine dependence, cigarettes, uncomplicated: Secondary | ICD-10-CM | POA: Insufficient documentation

## 2017-04-16 MED ORDER — CEFTRIAXONE SODIUM 250 MG IJ SOLR
250.0000 mg | Freq: Once | INTRAMUSCULAR | Status: AC
  Administered 2017-04-16: 250 mg via INTRAMUSCULAR
  Filled 2017-04-16: qty 250

## 2017-04-16 MED ORDER — STERILE WATER FOR INJECTION IJ SOLN
INTRAMUSCULAR | Status: AC
  Administered 2017-04-16: 10 mL
  Filled 2017-04-16: qty 10

## 2017-04-16 MED ORDER — AZITHROMYCIN 250 MG PO TABS
1000.0000 mg | ORAL_TABLET | Freq: Once | ORAL | Status: AC
  Administered 2017-04-16: 1000 mg via ORAL
  Filled 2017-04-16: qty 4

## 2017-04-16 NOTE — ED Provider Notes (Signed)
[facility] Provider Note   CSN: [id number] Arrival date & time: 04/16/17  1214   By signing my name below, I, Clarisse GougeXavier Herndon, attest that this documentation has been prepared under the direction and in the presence of [facility] Brantlee Hinde, FNP. Electronically Signed: Clarisse GougeXavier Herndon, Scribe. 04/16/17. 12:45 PM.   History   Chief Complaint Chief Complaint  Patient presents with  . Exposure to STD   The history is provided by the patient and medical records. No language interpreter was used.    Jerome Simpson is a 21 y.o. male with h/o STD exposure presenting to the Emergency Department with chief complaint of penile discharge x 2-3 days. Pt also notes an itching sensation to the penis. He notes sexual contact without barrier, and he states his sexual partner informed him that she tested positive for chlamydia recently. No groin lymph swelling, N/V/D, abdominal pain or back pain. No other complaints at this time.   Past Medical History:  Diagnosis Date  . STD (male)     There are no active problems to display for this patient.   History reviewed. No pertinent surgical history.     Home Medications    Prior to Admission medications   Medication Sig Start Date End Date Taking? Authorizing Provider  benzonatate (TESSALON) 100 MG capsule Take 2 capsules (200 mg total) by mouth 2 (two) times daily as needed for cough. 11/24/16   Roxy HorsemanBrowning, Robert, PA-C  oseltamivir (TAMIFLU) 75 MG capsule Take 1 capsule (75 mg total) by mouth every 12 (twelve) hours. 11/24/16   Roxy HorsemanBrowning, Robert, PA-C    Family History No family history on file.  Social History Social History  Substance Use Topics  . Smoking status: Current Every Day Smoker    Packs/day: 1.00    Years: 0.00    Types: Cigarettes  . Smokeless tobacco: Current User  . Alcohol use Yes     Allergies   Shrimp [shellfish allergy]   Review of Systems Review of Systems  Constitutional: Negative for chills and fever.    Gastrointestinal: Negative for abdominal pain, constipation, diarrhea, nausea and vomiting.  Genitourinary: Positive for discharge. Negative for penile pain, penile swelling and scrotal swelling.  Musculoskeletal: Negative for back pain.  Hematological: Negative for adenopathy.     Physical Exam Updated Vital Signs BP 122/60 (BP Location: Right Arm)   Pulse 60   Temp 98 F (36.7 C) (Oral)   Resp 16   Ht 6' (1.829 m)   Wt 180 lb (81.6 kg)   SpO2 97%   BMI 24.41 kg/m   Physical Exam  Constitutional: He is oriented to person, place, and time. He appears well-developed and well-nourished. No distress.  HENT:  Head: Normocephalic.  Eyes: EOM are normal.  Neck: Normal range of motion.  Cardiovascular: Normal rate.   Pulmonary/Chest: Effort normal.  Abdominal: Soft. There is no tenderness.  Genitourinary: Testes normal. Circumcised. No penile erythema or penile tenderness. Discharge found.  Genitourinary Comments: BB sized nodes bilateral inguinal areas. Male anatomy normal. Small amount of discharge clear in the urethra.  Musculoskeletal: Normal range of motion.  Lymphadenopathy: Inguinal adenopathy noted on the right and left side.  Neurological: He is alert and oriented to person, place, and time.  Skin: Skin is warm and dry.  Psychiatric: He has a normal mood and affect.  Nursing note and vitals reviewed.    ED Treatments / Results  DIAGNOSTIC STUDIES: Oxygen Saturation is 97% on RA, NL by my interpretation.  COORDINATION OF CARE: 12:44 PM-Discussed next steps with pt. Pt verbalized understanding and is agreeable with the plan. Pt prepared for STD swab. Will treat for STD. Pt offered HIV screening, which he declines at this time.   Labs (all labs ordered are listed, but only abnormal results are displayed) Labs Reviewed  GC/CHLAMYDIA PROBE AMP ([place]) NOT AT [facility]   Radiology No results found.  Procedures Procedures (including critical care  time)  Medications Ordered in ED Medications  cefTRIAXone (ROCEPHIN) injection 250 mg (250 mg Intramuscular Given 04/16/17 1307)  azithromycin (ZITHROMAX) tablet 1,000 mg (1,000 mg Oral Given 04/16/17 1307)  sterile water (preservative free) injection (10 mLs  Given 04/16/17 1308)     Initial Impression / Assessment and Plan / ED Course  I have reviewed the triage vital signs and the nursing notes. Patient with multiple sex partners and unprotected sex and chlamydia exposure treated in the ED for STI with Rocephin and Zithromax. Patient advised to inform and treat all sexual partners.  Pt advised on safe sex practices and understands that they have GC/Chlamydia cultures pending and will result in 2-3 days. HIV and RPR sent. Pt encouraged to follow up at local health department for future STI checks. No concern for prostatitis or epididymitis. Discussed return precautions. Pt appears safe for discharge.    Final Clinical Impressions(s) / ED Diagnoses   Final diagnoses:  STD exposure    New Prescriptions New Prescriptions   No medications on file  I personally performed the services described in this documentation, which was scribed in my presence. The recorded information has been reviewed and is accurate.    Kerrie Buffalo [place], [place] 04/16/17 1314    Rolland Porter, MD 04/20/17 [phone]

## 2017-04-16 NOTE — Discharge Instructions (Signed)
Follow up with the health department for additional screening.  °

## 2017-04-16 NOTE — ED Triage Notes (Signed)
Pt c/o of penile discharge for the last 2 days, states partner told him she tested positive for std recently.

## 2017-04-17 LAB — GC/CHLAMYDIA PROBE AMP (~~LOC~~) NOT AT ARMC
Chlamydia: NEGATIVE
Neisseria Gonorrhea: NEGATIVE

## 2018-01-03 ENCOUNTER — Emergency Department (HOSPITAL_COMMUNITY)
Admission: EM | Admit: 2018-01-03 | Discharge: 2018-01-03 | Disposition: A | Discharge status: Home / Self Care | Payer: Self-pay | Attending: Emergency Medicine | Admitting: Emergency Medicine

## 2018-01-03 ENCOUNTER — Encounter (HOSPITAL_COMMUNITY): Payer: Self-pay | Admitting: *Deleted

## 2018-01-03 ENCOUNTER — Other Ambulatory Visit: Payer: Self-pay

## 2018-01-03 DIAGNOSIS — R300 Dysuria: Secondary | ICD-10-CM | POA: Insufficient documentation

## 2018-01-03 DIAGNOSIS — R360 Urethral discharge without blood: Secondary | ICD-10-CM | POA: Insufficient documentation

## 2018-01-03 DIAGNOSIS — R369 Urethral discharge, unspecified: Secondary | ICD-10-CM

## 2018-01-03 DIAGNOSIS — F17210 Nicotine dependence, cigarettes, uncomplicated: Secondary | ICD-10-CM | POA: Insufficient documentation

## 2018-01-03 MED ORDER — AZITHROMYCIN 250 MG PO TABS
1000.0000 mg | ORAL_TABLET | Freq: Once | ORAL | Status: AC
Start: 2018-01-03 — End: 2018-01-03
  Administered 2018-01-03: 1000 mg via ORAL
  Filled 2018-01-03: qty 4

## 2018-01-03 MED ORDER — CEFTRIAXONE SODIUM 250 MG IJ SOLR
250.0000 mg | Freq: Once | INTRAMUSCULAR | Status: AC
  Administered 2018-01-03: 250 mg via INTRAMUSCULAR
  Filled 2018-01-03: qty 250

## 2018-01-03 MED ORDER — LIDOCAINE HCL (PF) 1 % IJ SOLN
INTRAMUSCULAR | Status: AC
  Administered 2018-01-03: 5 mL
  Filled 2018-01-03: qty 5

## 2018-01-03 NOTE — ED Triage Notes (Signed)
Pt reports having unprotected sex 6 days ago and wants to be checked for std.

## 2018-01-03 NOTE — ED Provider Notes (Signed)
[facility] EMERGENCY DEPARTMENT Provider Note   CSN: [id number] Arrival date & time: 01/03/18  1730     History   Chief Complaint Chief Complaint  Patient presents with  . SEXUALLY TRANSMITTED DISEASE    HPI Jerome Simpson is a 22 y.o. male who presents to the ED wit request for STI testing after he reports having unprotected sex with a new partner 6 day ago. Patient reports that he has multiple sex partners and he and his friend both had sex with this girl last week more than once. Patient c/o dysuria and discharge from his penis.   HPI  Past Medical History:  Diagnosis Date  . STD (male)     There are no active problems to display for this patient.   History reviewed. No pertinent surgical history.     Home Medications    Prior to Admission medications   Medication Sig Start Date End Date Taking? Authorizing Provider  benzonatate (TESSALON) 100 MG capsule Take 2 capsules (200 mg total) by mouth 2 (two) times daily as needed for cough. 11/24/16   Roxy Horseman, PA-C  oseltamivir (TAMIFLU) 75 MG capsule Take 1 capsule (75 mg total) by mouth every 12 (twelve) hours. 11/24/16   Roxy Horseman, PA-C    Family History History reviewed. No pertinent family history.  Social History Social History   Tobacco Use  . Smoking status: Current Every Day Smoker    Packs/day: 1.00    Years: 0.00    Pack years: 0.00    Types: Cigarettes  . Smokeless tobacco: Current User  Substance Use Topics  . Alcohol use: Yes  . Drug use: Yes    Types: Marijuana     Allergies   Shrimp [shellfish allergy]   Review of Systems Review of Systems  Constitutional: Negative for chills and fever.  HENT: Negative.   Gastrointestinal: Negative for abdominal pain, nausea and vomiting.  Genitourinary: Positive for discharge and dysuria. Negative for frequency, hematuria, penile swelling, scrotal swelling and testicular pain.  Musculoskeletal: Negative for back  pain.  Skin: Negative for rash.     Physical Exam Updated Vital Signs BP 135/87 (BP Location: Right Arm)   Pulse 64   Temp 99 F (37.2 C) (Oral)   Resp 16   SpO2 100%   Physical Exam  Constitutional: He appears well-developed and well-nourished. No distress.  HENT:  Head: Normocephalic.  Eyes: EOM are normal.  Neck: Neck supple.  Cardiovascular: Normal rate.  Pulmonary/Chest: Effort normal.  Abdominal: Soft. There is no tenderness.  Genitourinary: Testes normal. Circumcised. Discharge found.  Musculoskeletal: Normal range of motion.  Lymphadenopathy: Inguinal adenopathy noted on the right side.  Neurological: He is alert.  Skin: Skin is warm and dry.  Psychiatric: He has a normal mood and affect.  Nursing note and vitals reviewed.    ED Treatments / Results  Labs (all labs ordered are listed, but only abnormal results are displayed) Labs Reviewed  HIV ANTIBODY (ROUTINE TESTING)  RPR  GC/CHLAMYDIA PROBE AMP ([place]) NOT AT [facility]   Radiology No results found.  Procedures Procedures (including critical care time)  Medications Ordered in ED Medications  cefTRIAXone (ROCEPHIN) injection 250 mg (not administered)  azithromycin (ZITHROMAX) tablet 1,000 mg (not administered)     Initial Impression / Assessment and Plan / ED Course  I have reviewed the triage vital signs and the nursing notes. Pt presents with concerns for possible STD.  Pt understands that they have GC/Chlamydia cultures pending and  that they will need to inform all sexual partners if results return positive. Pt has been treated prophylactically with azithromycin and Rocephin due to pts history and exam. Patient to be discharged with instructions to follow up with [facility]. Discussed importance of using protection when sexually active.   Final Clinical Impressions(s) / ED Diagnoses   Final diagnoses:  Dysuria  Penile discharge    ED Discharge Orders    None       Kerrie Buffaloeese, Joni Norrod Sard[place]M,  [place] 01/03/18 1813    Cathren LaineSteinl, Kevin, MD 01/03/18 [phone]

## 2018-01-03 NOTE — ED Notes (Signed)
Declined W/C at D/C and was escorted to lobby by RN. 

## 2018-01-04 LAB — RPR: RPR Ser Ql: NONREACTIVE

## 2018-01-04 LAB — HIV ANTIBODY (ROUTINE TESTING W REFLEX): HIV SCREEN 4TH GENERATION: NONREACTIVE

## 2018-01-05 LAB — GC/CHLAMYDIA PROBE AMP (~~LOC~~) NOT AT ARMC
CHLAMYDIA, DNA PROBE: POSITIVE — AB
Neisseria Gonorrhea: POSITIVE — AB

## 2018-05-26 ENCOUNTER — Encounter (HOSPITAL_COMMUNITY): Payer: Self-pay | Admitting: *Deleted

## 2018-05-26 ENCOUNTER — Emergency Department (HOSPITAL_COMMUNITY)
Admission: EM | Admit: 2018-05-26 | Discharge: 2018-05-26 | Disposition: A | Discharge status: Home / Self Care | Payer: Self-pay | Attending: Emergency Medicine | Admitting: Emergency Medicine

## 2018-05-26 ENCOUNTER — Emergency Department (HOSPITAL_COMMUNITY): Payer: Self-pay

## 2018-05-26 DIAGNOSIS — Y998 Other external cause status: Secondary | ICD-10-CM | POA: Insufficient documentation

## 2018-05-26 DIAGNOSIS — W010XXA Fall on same level from slipping, tripping and stumbling without subsequent striking against object, initial encounter: Secondary | ICD-10-CM | POA: Insufficient documentation

## 2018-05-26 DIAGNOSIS — Y92310 Basketball court as the place of occurrence of the external cause: Secondary | ICD-10-CM | POA: Insufficient documentation

## 2018-05-26 DIAGNOSIS — S63501A Unspecified sprain of right wrist, initial encounter: Secondary | ICD-10-CM | POA: Insufficient documentation

## 2018-05-26 DIAGNOSIS — F17210 Nicotine dependence, cigarettes, uncomplicated: Secondary | ICD-10-CM | POA: Insufficient documentation

## 2018-05-26 DIAGNOSIS — Y9367 Activity, basketball: Secondary | ICD-10-CM | POA: Insufficient documentation

## 2018-05-26 NOTE — Discharge Instructions (Addendum)
You are evaluated in the emergency department for a right wrist injury after a fall.  Your x-rays did not show an obvious fracture.  We are providing you with splint to use and you should use Tylenol or ibuprofen for pain.  If your pain in the wrist continues for more than 4 or 5 days he should return to the emergency department for repeat evaluation.

## 2018-05-26 NOTE — ED Triage Notes (Signed)
Pt complains of right wrist pain since falling down while playing basketball yesterday. Pt is unable to bend right wrist.

## 2018-05-26 NOTE — ED Provider Notes (Signed)
[facility]-EMERGENCY DEPT Provider Note   CSN: [id number] Arrival date & time: 05/26/18  1000     History   Chief Complaint Chief Complaint  Patient presents with  . Wrist Pain    HPI Jerome Simpson is a 22 y.o. male.  22 year old male right-hand-dominant complaining of right wrist pain after playing basketball yesterday and falling on it.  States it did not bother him yesterday when he woke up this morning is having severe pain and unable to bend it.  Is not associated with any numbness or tingling.  When asked to identify the area that hurts he points diffusely across his right wrist.  He has no finger or hand pain and no pain at the elbow or shoulder.  He is tried nothing for it.  The history is provided by the patient.  Wrist Pain  This is a new problem. The current episode started yesterday. The problem occurs constantly. The problem has not changed since onset.Pertinent negatives include no chest pain, no abdominal pain, no headaches and no shortness of breath. The symptoms are aggravated by bending and twisting. Nothing relieves the symptoms. He has tried nothing for the symptoms. The treatment provided no relief.    Past Medical History:  Diagnosis Date  . STD (male)     There are no active problems to display for this patient.   History reviewed. No pertinent surgical history.      Home Medications    Prior to Admission medications   Medication Sig Start Date End Date Taking? Authorizing Provider  benzonatate (TESSALON) 100 MG capsule Take 2 capsules (200 mg total) by mouth 2 (two) times daily as needed for cough. 11/24/16   Roxy HorsemanBrowning, Robert, PA-C  oseltamivir (TAMIFLU) 75 MG capsule Take 1 capsule (75 mg total) by mouth every 12 (twelve) hours. 11/24/16   Roxy HorsemanBrowning, Robert, PA-C    Family History No family history on file.  Social History Social History   Tobacco Use  . Smoking status: Current Every Day Smoker    Packs/day: 1.00   Years: 0.00    Pack years: 0.00    Types: Cigarettes  . Smokeless tobacco: Current User  Substance Use Topics  . Alcohol use: Yes  . Drug use: Yes    Types: Marijuana     Allergies   Shrimp [shellfish allergy]   Review of Systems Review of Systems  Constitutional: Negative for fever.  HENT: Negative for sore throat.   Eyes: Negative for visual disturbance.  Respiratory: Negative for shortness of breath.   Cardiovascular: Negative for chest pain.  Gastrointestinal: Negative for abdominal pain.  Genitourinary: Negative for dysuria.  Musculoskeletal: Negative for neck pain.  Skin: Negative for rash and wound.  Neurological: Negative for headaches.     Physical Exam Updated Vital Signs BP 129/83 (BP Location: Left Arm)   Pulse 66   Temp 98.2 F (36.8 C) (Oral)   Resp 15   SpO2 100%   Physical Exam  Constitutional: He appears well-developed and well-nourished.  HENT:  Head: Normocephalic and atraumatic.  Eyes: Conjunctivae are normal.  Neck: Neck supple.  Pulmonary/Chest: Effort normal.  Musculoskeletal:  His right wrist is diffusely tender including snuffbox.  There is a moderate amount of swelling at the wrist.  Full range of motion of all digits and nontender.  Elbow and shoulder full range of motion nontender.  Good distal neurovascular intact and normal CSMs.  Other joints unaffected.  Neurological: He is alert. GCS eye subscore is 4.  GCS verbal subscore is 5. GCS motor subscore is 6.  Skin: Skin is warm and dry.  Psychiatric: He has a normal mood and affect.  Nursing note and vitals reviewed.    ED Treatments / Results  Labs (all labs ordered are listed, but only abnormal results are displayed) Labs Reviewed - No data to display  EKG None  Radiology Dg Wrist Complete Right  Result Date: 05/26/2018 CLINICAL DATA:  Pain following fall EXAM: RIGHT WRIST - COMPLETE 3+ VIEW COMPARISON:  Right hand radiographs February 23, 2015 FINDINGS: Frontal, oblique,  lateral, and ulnar deviation scaphoid images were obtained. There is an old healed fracture of the fifth metacarpal with volar angulation distally. There is no evident acute fracture or dislocation. Joint spaces appear normal. No erosive change. IMPRESSION: Old healed fracture fifth metacarpal. No acute fracture or dislocation. No evident arthropathy. Electronically Signed   By: Bretta Bang III M.D.   On: 05/26/2018 11:38    Procedures Procedures (including critical care time)  Medications Ordered in ED Medications - No data to display   Initial Impression / Assessment and Plan / ED Course  I have reviewed the triage vital signs and the nursing notes.  Pertinent labs & imaging results that were available during my care of the patient were reviewed by me and considered in my medical decision making (see chart for details).  Clinical Course as of May 28 1731  Wed May 26, 2018  1634 22 year old right-hand-dominant male with injury to right wrist after a fall playing basketball yesterday.  On exam he has diffuse tenderness over his wrist.  His x-rays are negative for acute fracture.  We will place him in a Velcro splint and have him follow-up with hand if continued pain.   [MB]    Clinical Course User Index [MB] Terrilee Files, MD    Final Clinical Impressions(s) / ED Diagnoses   Final diagnoses:  Sprain of right wrist, initial encounter    ED Discharge Orders    None       Terrilee Files, MD 05/27/18 1732

## 2018-07-12 ENCOUNTER — Encounter (HOSPITAL_COMMUNITY): Payer: Self-pay | Admitting: Emergency Medicine

## 2018-07-12 ENCOUNTER — Emergency Department (HOSPITAL_COMMUNITY)
Admission: EM | Admit: 2018-07-12 | Discharge: 2018-07-13 | Disposition: A | Discharge status: Home / Self Care | Payer: Self-pay | Attending: Emergency Medicine | Admitting: Emergency Medicine

## 2018-07-12 DIAGNOSIS — F17210 Nicotine dependence, cigarettes, uncomplicated: Secondary | ICD-10-CM | POA: Insufficient documentation

## 2018-07-12 DIAGNOSIS — Z202 Contact with and (suspected) exposure to infections with a predominantly sexual mode of transmission: Secondary | ICD-10-CM | POA: Insufficient documentation

## 2018-07-12 LAB — URINALYSIS, ROUTINE W REFLEX MICROSCOPIC
Bilirubin Urine: NEGATIVE
Glucose, UA: NEGATIVE mg/dL
Hgb urine dipstick: NEGATIVE
Ketones, ur: NEGATIVE mg/dL
Nitrite: NEGATIVE
Protein, ur: NEGATIVE mg/dL
SPECIFIC GRAVITY, URINE: 1.016 (ref 1.005–1.030)
pH: 7 (ref 5.0–8.0)

## 2018-07-12 LAB — RAPID HIV SCREEN (HIV 1/2 AB+AG)
HIV 1/2 ANTIBODIES: NONREACTIVE
HIV-1 P24 Antigen - HIV24: NONREACTIVE

## 2018-07-12 NOTE — ED Triage Notes (Signed)
Pt presents with possible exposure to STD; pts girlfriend tested on 8/29 and found out today that she was positive; pt denies symptoms currently

## 2018-07-13 LAB — GC/CHLAMYDIA PROBE AMP (~~LOC~~) NOT AT ARMC
Chlamydia: POSITIVE — AB
Neisseria Gonorrhea: POSITIVE — AB

## 2018-07-13 MED ORDER — LIDOCAINE HCL (PF) 1 % IJ SOLN
INTRAMUSCULAR | Status: AC
  Administered 2018-07-13: 5 mL
  Filled 2018-07-13: qty 5

## 2018-07-13 MED ORDER — CEFTRIAXONE SODIUM 250 MG IJ SOLR
250.0000 mg | Freq: Once | INTRAMUSCULAR | Status: AC
  Administered 2018-07-13: 250 mg via INTRAMUSCULAR
  Filled 2018-07-13: qty 250

## 2018-07-13 MED ORDER — AZITHROMYCIN 250 MG PO TABS
1000.0000 mg | ORAL_TABLET | Freq: Once | ORAL | Status: AC
  Administered 2018-07-13: 1000 mg via ORAL
  Filled 2018-07-13: qty 4

## 2018-07-13 MED ORDER — METRONIDAZOLE 500 MG PO TABS
2000.0000 mg | ORAL_TABLET | Freq: Once | ORAL | Status: AC
  Administered 2018-07-13: 2000 mg via ORAL
  Filled 2018-07-13: qty 4

## 2018-07-13 NOTE — Discharge Instructions (Signed)
You have been treated for gonorrhea, chlamydia, and trichomonas.  We recommend that you stay abstinent for at least 7 to 10 days to allow medications to take effect. You can follow-up with the [place] [facility] for STD testing in the future.

## 2018-07-13 NOTE — ED Provider Notes (Signed)
[facility] EMERGENCY DEPARTMENT Provider Note   CSN: [id number] Arrival date & time: 07/12/18  2225     History   Chief Complaint Chief Complaint  Patient presents with  . Exposure to STD    HPI Jerome Simpson is a 22 y.o. male.  The history is provided by the patient and medical records.  Exposure to STD     22 year old male presenting to the ED with exposure to STD.  Girlfriend called and reported she "tested positive".  She is on the phone with patient during exam, reports she was positive for gonorrhea, chlamydia, and trichomonas.  Patient himself is asymptomatic.  No dysuria or penile discharge.  He has no history of STD.  Past Medical History:  Diagnosis Date  . STD (male)     There are no active problems to display for this patient.   History reviewed. No pertinent surgical history.      Home Medications    Prior to Admission medications   Medication Sig Start Date End Date Taking? Authorizing Provider  benzonatate (TESSALON) 100 MG capsule Take 2 capsules (200 mg total) by mouth 2 (two) times daily as needed for cough. 11/24/16   Roxy Horseman, PA-C  oseltamivir (TAMIFLU) 75 MG capsule Take 1 capsule (75 mg total) by mouth every 12 (twelve) hours. 11/24/16   Roxy Horseman, PA-C    Family History History reviewed. No pertinent family history.  Social History Social History   Tobacco Use  . Smoking status: Current Every Day Smoker    Packs/day: 1.00    Years: 0.00    Pack years: 0.00    Types: Cigarettes  . Smokeless tobacco: Current User  Substance Use Topics  . Alcohol use: Yes  . Drug use: Yes    Types: Marijuana     Allergies   Shrimp [shellfish allergy]   Review of Systems Review of Systems  Genitourinary:       STD exposure  All other systems reviewed and are negative.    Physical Exam Updated Vital Signs BP 119/77   Pulse 70   Temp (!) 97.5 F (36.4 C) (Oral)   Resp 16   Ht 6' (1.829 m)   Wt 81.6  kg   SpO2 100%   BMI 24.41 kg/m   Physical Exam  Constitutional: He is oriented to person, place, and time. He appears well-developed and well-nourished.  HENT:  Head: Normocephalic and atraumatic.  Mouth/Throat: Oropharynx is clear and moist.  Eyes: Pupils are equal, round, and reactive to light. Conjunctivae and EOM are normal.  Neck: Normal range of motion.  Cardiovascular: Normal rate, regular rhythm and normal heart sounds.  Pulmonary/Chest: Effort normal and breath sounds normal. No stridor. No respiratory distress.  Abdominal: Soft. Bowel sounds are normal. There is no tenderness. There is no rebound.  Genitourinary: Penis normal. Circumcised. No penile erythema. No discharge found.  Musculoskeletal: Normal range of motion.  Neurological: He is alert and oriented to person, place, and time.  Skin: Skin is warm and dry.  Psychiatric: He has a normal mood and affect.  Nursing note and vitals reviewed.    ED Treatments / Results  Labs (all labs ordered are listed, but only abnormal results are displayed) Labs Reviewed  URINALYSIS, ROUTINE W REFLEX MICROSCOPIC - Abnormal; Notable for the following components:      Result Value   Leukocytes, UA TRACE (*)    Bacteria, UA RARE (*)    All other components within normal limits  RAPID HIV SCREEN (HIV 1/2 AB+AG)  GC/CHLAMYDIA PROBE AMP ([place]) NOT AT [facility]    EKG None  Radiology No results found.  Procedures Procedures (including critical care time)  Medications Ordered in ED Medications  lidocaine (PF) (XYLOCAINE) 1 % injection (has no administration in time range)  metroNIDAZOLE (FLAGYL) tablet 2,000 mg (2,000 mg Oral Given 07/13/18 0148)  cefTRIAXone (ROCEPHIN) injection 250 mg (250 mg Intramuscular Given 07/13/18 0146)  azithromycin (ZITHROMAX) tablet 1,000 mg (1,000 mg Oral Given 07/13/18 0148)     Initial Impression / Assessment and Plan / ED Course  I have reviewed the triage vital signs and the nursing  notes.  Pertinent labs & imaging results that were available during my care of the patient were reviewed by me and considered in my medical decision making (see chart for details).  48 y.o. M here after STD exposure.  Girlfriend tested positive for gonorrhea, chlamydia, and trichomonas.  Patient is asymptomatic. Gc/chl, HIV, and RPR sent from triage.  Will treat with Rocephin, azithromycin, and Flagyl.  I discussed with patient and his partner that they should remain abstinent for at least 7 to 10 days to allow medications to take full effect.  Can follow-up with the health department for routine STD screenings in the future.  Discussed plan with patient, he acknowledged understanding and agreed with plan of care.  Return precautions given for new or worsening symptoms.  Final Clinical Impressions(s) / ED Diagnoses   Final diagnoses:  STD exposure    ED Discharge Orders    None       Garlon Hatchet, PA-C 07/13/18 0219    Dione Booze, MD 07/13/18 0800

## 2018-08-28 IMAGING — CR DG WRIST COMPLETE 3+V*R*
4 series · 4 of 4 positions shown · non-contrast
Comparison: Right hand radiographs February 23, 2015

CLINICAL DATA: Pain following fall

EXAM:
RIGHT WRIST - COMPLETE 3+ VIEW

[x wrist pa right]
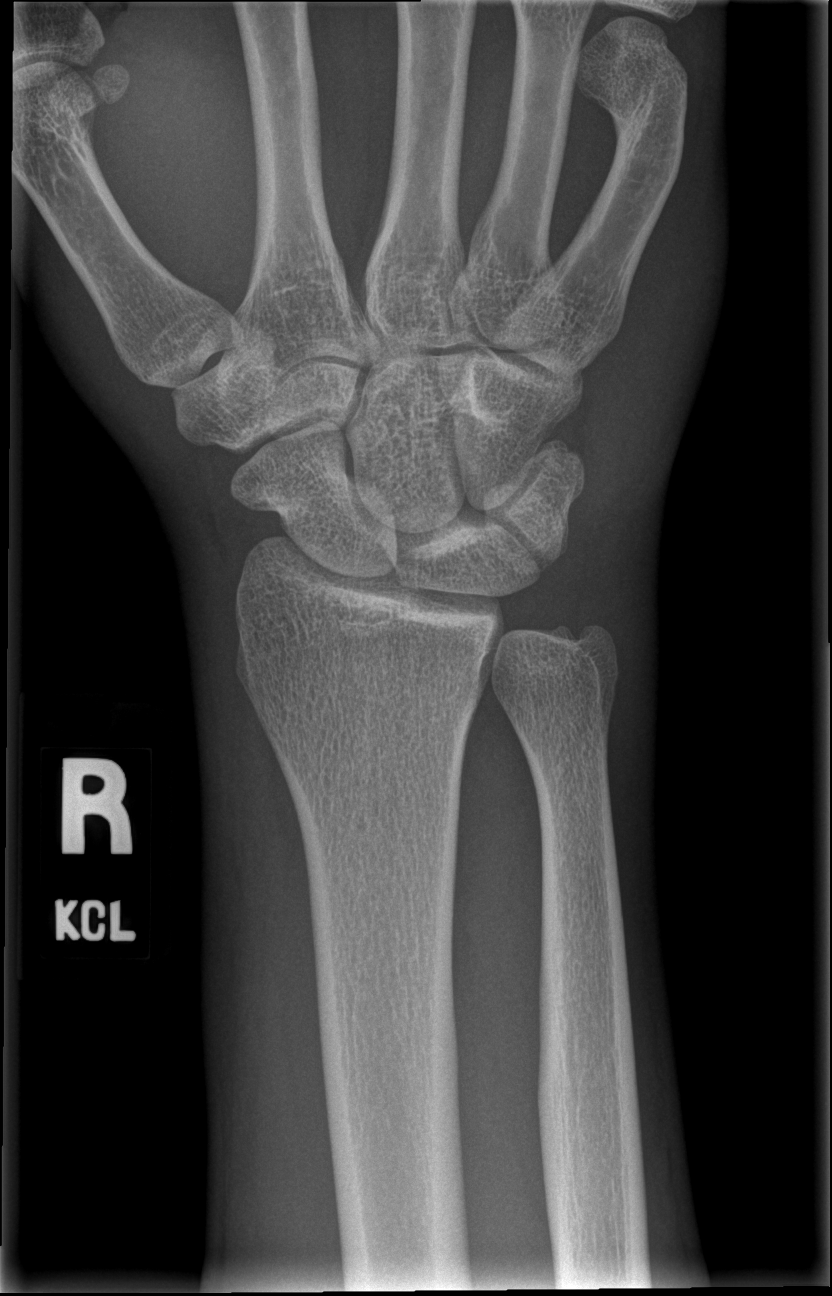

[x wrist obl right]
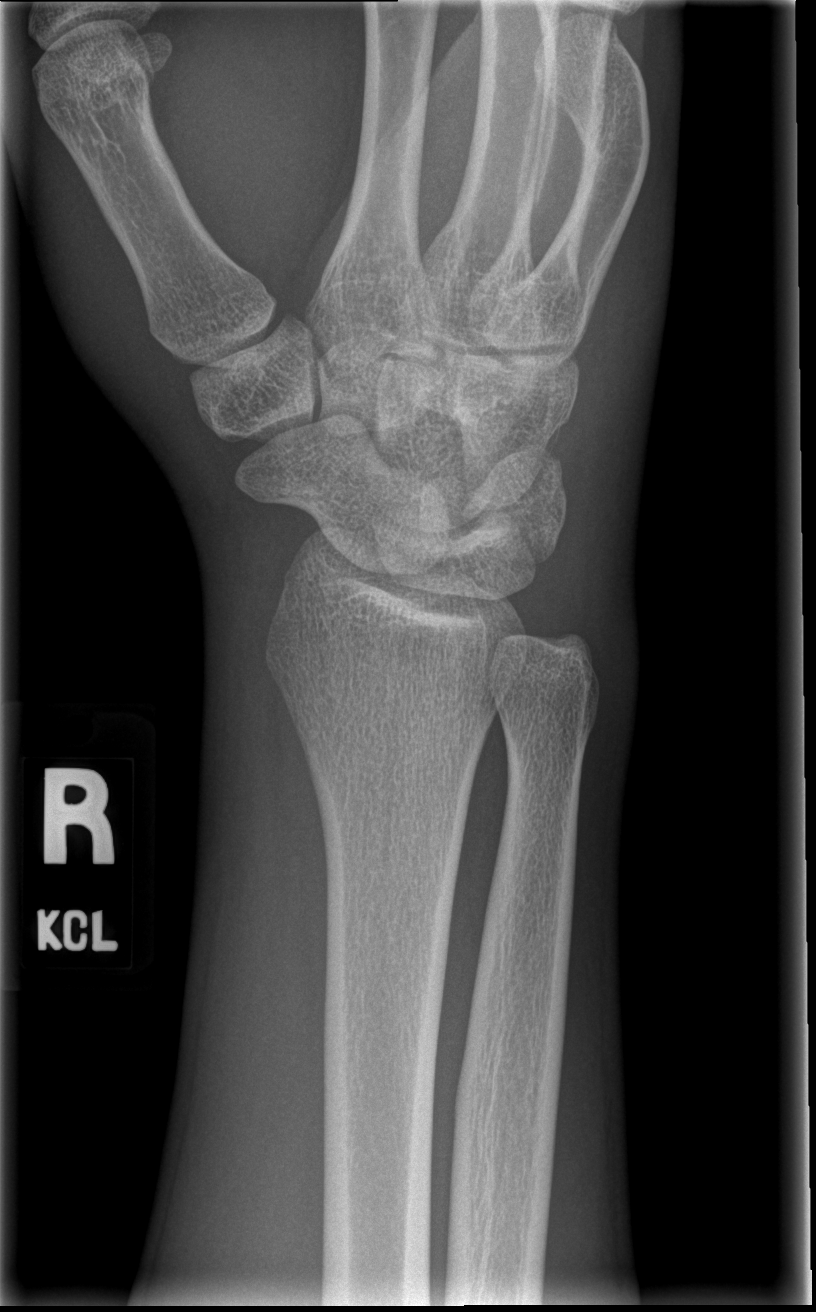

[x wrist lat right]
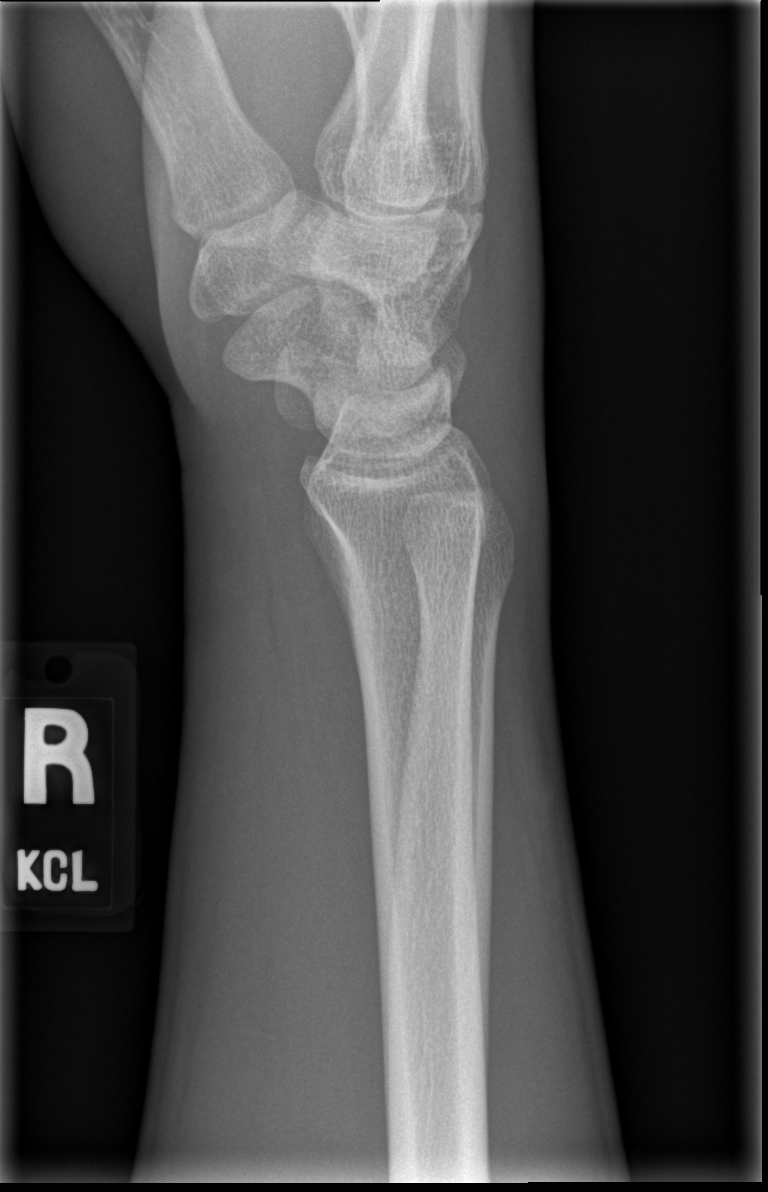

[x wrist navicular view right]
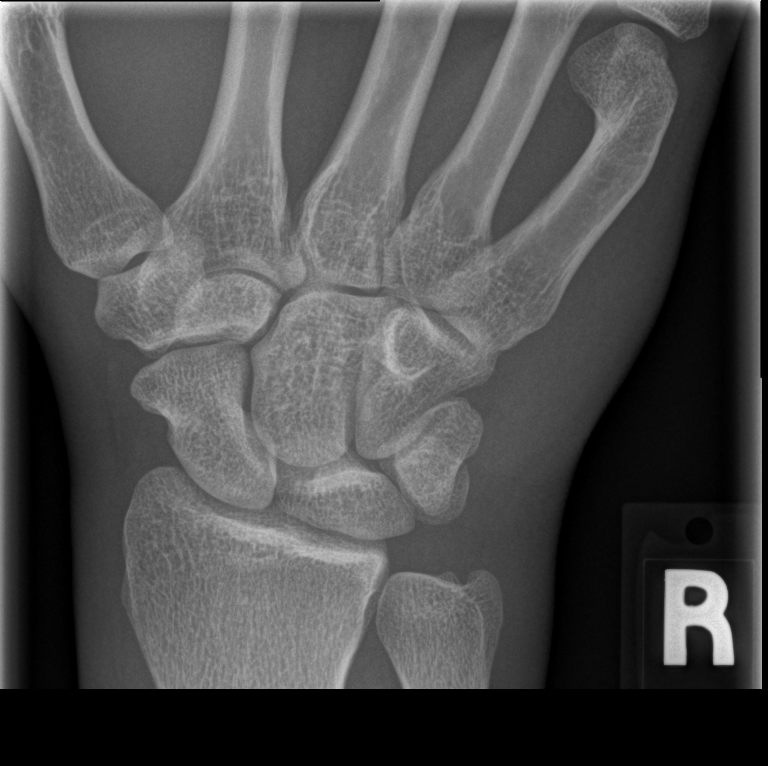

[4 of 4 positions shown; findings below may reference images not displayed]

FINDINGS: Frontal, oblique, lateral, and ulnar deviation scaphoid images were
obtained. There is an old healed fracture of the fifth metacarpal
with volar angulation distally. There is no evident acute fracture
or dislocation. Joint spaces appear normal. No erosive change.
IMPRESSION: Old healed fracture fifth metacarpal. No acute fracture or
dislocation. No evident arthropathy.

## 2018-08-29 ENCOUNTER — Encounter (HOSPITAL_COMMUNITY): Payer: Self-pay | Admitting: Emergency Medicine

## 2018-08-29 ENCOUNTER — Emergency Department (HOSPITAL_COMMUNITY)
Admission: EM | Admit: 2018-08-29 | Discharge: 2018-08-29 | Disposition: A | Discharge status: Home / Self Care | Payer: Self-pay | Attending: Emergency Medicine | Admitting: Emergency Medicine

## 2018-08-29 DIAGNOSIS — Z79899 Other long term (current) drug therapy: Secondary | ICD-10-CM | POA: Insufficient documentation

## 2018-08-29 DIAGNOSIS — Z711 Person with feared health complaint in whom no diagnosis is made: Secondary | ICD-10-CM | POA: Insufficient documentation

## 2018-08-29 DIAGNOSIS — F17210 Nicotine dependence, cigarettes, uncomplicated: Secondary | ICD-10-CM | POA: Insufficient documentation

## 2018-08-29 LAB — URINALYSIS, ROUTINE W REFLEX MICROSCOPIC
Bilirubin Urine: NEGATIVE
GLUCOSE, UA: NEGATIVE mg/dL
HGB URINE DIPSTICK: NEGATIVE
Ketones, ur: NEGATIVE mg/dL
LEUKOCYTES UA: NEGATIVE
Nitrite: NEGATIVE
PH: 8 (ref 5.0–8.0)
PROTEIN: NEGATIVE mg/dL
Specific Gravity, Urine: 1.015 (ref 1.005–1.030)

## 2018-08-29 NOTE — ED Triage Notes (Signed)
Patient to ER for re-evaluation of STI exposure from a few weeks prior. States was treated for Chlamydia, states waited 9 days to have intercourse with his partner but states his penis "feels irritated." denies dysuria or penile discharge.

## 2018-08-29 NOTE — ED Notes (Signed)
Pt stable and ambulatory for discharge, states understanding follow up.  

## 2018-08-29 NOTE — Discharge Instructions (Signed)
Evaluated today for STD exposure.  You were tested for gonorrhea and chlamydia in your urine today.  It will take 24 to 48 hours of the results to come back.  If they are positive he will be notified you will need to return for treatment.  Return to the ED for new or worsening symptoms.

## 2018-08-29 NOTE — ED Provider Notes (Signed)
[facility] EMERGENCY DEPARTMENT Provider Note   CSN: [id number] Arrival date & time: 08/29/18  1403     History   Chief Complaint Chief Complaint  Patient presents with  . Exposure to STD    HPI Jerome Simpson is a 22 y.o. male with past medical history significant for gonorrhea and chlamydia who presents for evaluation of STD exposure.  Patient states he tested positive for gonorrhea chlamydia approximately 2 weeks ago.  Patient states he was treated while in the emergency department and he waited approximately 9 days before having intercourse.  His partner was treated as well at that same time.  States when he had intercourse this morning patient states that his penis "felt irritated."  States it is not felt like this previously.  Denies urinary symptoms, discharge, penile or testicular lesions or sores.  Denies abdominal pain, fever, nausea, vomiting.  Patient states he would like to be tested again to "see if I still have gonorrhea and chlamydia."  Does not want repeat HIV or syphilis testing at this time.  History obtained from patient.  No interpreter was used.  HPI  Past Medical History:  Diagnosis Date  . STD (male)     There are no active problems to display for this patient.   History reviewed. No pertinent surgical history.      Home Medications    Prior to Admission medications   Medication Sig Start Date End Date Taking? Authorizing Provider  benzonatate (TESSALON) 100 MG capsule Take 2 capsules (200 mg total) by mouth 2 (two) times daily as needed for cough. 11/24/16   Roxy Horseman, PA-C  oseltamivir (TAMIFLU) 75 MG capsule Take 1 capsule (75 mg total) by mouth every 12 (twelve) hours. 11/24/16   Roxy Horseman, PA-C    Family History History reviewed. No pertinent family history.  Social History Social History   Tobacco Use  . Smoking status: Current Every Day Smoker    Packs/day: 1.00    Years: 0.00    Pack years: 0.00   Types: Cigarettes  . Smokeless tobacco: Current User  Substance Use Topics  . Alcohol use: Yes  . Drug use: Yes    Types: Marijuana     Allergies   Shrimp [shellfish allergy]   Review of Systems Review of Systems  Constitutional: Negative.   HENT: Negative.   Respiratory: Negative.   Cardiovascular: Negative.   Gastrointestinal: Negative.   Genitourinary: Negative for decreased urine volume, difficulty urinating, discharge, dysuria, enuresis, flank pain, frequency, genital sores, hematuria, penile pain, penile swelling, scrotal swelling, testicular pain and urgency.       Penile irritation.  Musculoskeletal: Negative.   Skin: Negative.   Neurological: Negative.   All other systems reviewed and are negative.    Physical Exam Updated Vital Signs BP 121/77 (BP Location: Right Arm)   Pulse 60   Temp 98.2 F (36.8 C) (Oral)   Resp 16   Ht 6' (1.829 m)   Wt 83.9 kg   SpO2 98%   BMI 25.09 kg/m   Physical Exam  Constitutional: He appears well-developed and well-nourished. No distress.  HENT:  Head: Atraumatic.  Eyes: Pupils are equal, round, and reactive to light.  Neck: Normal range of motion. Neck supple.  Cardiovascular: Normal rate and regular rhythm.  Pulmonary/Chest: Effort normal. No respiratory distress.  Abdominal: Soft. Normal appearance and bowel sounds are normal. He exhibits no distension. There is no tenderness. There is no rigidity, no rebound and no guarding.  Genitourinary: Testes normal and penis normal. Cremasteric reflex is present. Right testis shows no mass, no swelling and no tenderness. Right testis is descended. Cremasteric reflex is not absent on the right side. Left testis shows no mass, no swelling and no tenderness. Left testis is descended. Cremasteric reflex is not absent on the left side. Circumcised. No phimosis, paraphimosis, hypospadias, penile erythema or penile tenderness. No discharge found.  Genitourinary Comments: No penile  discharge, lesions or sores to GU area.  GU exam performed with chaperone in room.  No tenderness to epididymis.  Tenderness to palpation of the testes.  Musculoskeletal: Normal range of motion.  Lymphadenopathy: No inguinal adenopathy noted on the right or left side.  Neurological: He is alert.  Skin: Skin is warm and dry. No abrasion, no bruising, no ecchymosis, no laceration, no lesion and no rash noted. He is not diaphoretic. No erythema.  Psychiatric: He has a normal mood and affect.  Nursing note and vitals reviewed.    ED Treatments / Results  Labs (all labs ordered are listed, but only abnormal results are displayed) Labs Reviewed  URINALYSIS, ROUTINE W REFLEX MICROSCOPIC  GC/CHLAMYDIA PROBE AMP ([place]) NOT AT [facility]    EKG None  Radiology No results found.  Procedures Procedures (including critical care time)  Medications Ordered in ED Medications - No data to display   Initial Impression / Assessment and Plan / ED Course  I have reviewed the triage vital signs and the nursing notes.  Pertinent labs & imaging results that were available during my care of the patient were reviewed by me and considered in my medical decision making (see chart for details).  22 year old male who appears otherwise well presents for evaluation of possible STD exposure.  History of gonorrhea and chlamydia and was treated at that time.  Admits to penile irritation today with sexual intercourse.  Denies discharge, rashes or lesions.  Patient is afebrile without abdominal tenderness, abdominal pain or painful bowel movements to indicate prostatitis.  No tenderness to palpation of the testes or epididymis to suggest orchitis or epididymitis.  Refuses prophylactic treatment for STDs while in the department.  Will obtain GC/chlamydia at this time.  Patient is aware that his results are pending at the time of DC.  Will be notified in 24 to 48 hours if his results are positive patient aware that he  will have to return if his results are positive for treatment.  Discussed that his partner would also need to be treated as well.  Discussed he should wait at least a week before sexual intercourse if his results are positive.  Discussed reasons to return to the emergency department.  Does not want HIV or syphilis testing at this time.  Patient voices understanding is agreeable for discharge at this time.  Patient is hemodynamically stable in no acute distress at time of DC.    Final Clinical Impressions(s) / ED Diagnoses   Final diagnoses:  Concern about STD in male without diagnosis    ED Discharge Orders    None       Keshara Kiger A, PA-C 08/29/18 1559    Melene Plan, DO 08/29/18 1608

## 2018-11-15 ENCOUNTER — Encounter (HOSPITAL_COMMUNITY): Payer: Self-pay | Admitting: Emergency Medicine

## 2018-11-15 ENCOUNTER — Other Ambulatory Visit: Payer: Self-pay

## 2018-11-15 ENCOUNTER — Emergency Department (HOSPITAL_COMMUNITY)
Admission: EM | Admit: 2018-11-15 | Discharge: 2018-11-15 | Disposition: A | Discharge status: Home / Self Care | Payer: Federal, State, Local not specified - PPO | Attending: Emergency Medicine | Admitting: Emergency Medicine

## 2018-11-15 DIAGNOSIS — R369 Urethral discharge, unspecified: Secondary | ICD-10-CM

## 2018-11-15 DIAGNOSIS — F17210 Nicotine dependence, cigarettes, uncomplicated: Secondary | ICD-10-CM | POA: Insufficient documentation

## 2018-11-15 MED ORDER — CEFTRIAXONE SODIUM 250 MG IJ SOLR
250.0000 mg | Freq: Once | INTRAMUSCULAR | Status: AC
Start: 2018-11-15 — End: 2018-11-15
  Administered 2018-11-15: 250 mg via INTRAMUSCULAR
  Filled 2018-11-15: qty 250

## 2018-11-15 MED ORDER — AZITHROMYCIN 250 MG PO TABS
1000.0000 mg | ORAL_TABLET | Freq: Once | ORAL | Status: AC
  Administered 2018-11-15: 1000 mg via ORAL
  Filled 2018-11-15: qty 4

## 2018-11-15 MED ORDER — STERILE WATER FOR INJECTION IJ SOLN
INTRAMUSCULAR | Status: AC
  Administered 2018-11-15: 0.9 mL
  Filled 2018-11-15: qty 10

## 2018-11-15 NOTE — ED Triage Notes (Signed)
Pt reports discharge from his penis. States that he has had unprotected sex with 1 new person and has had protected sex with 2 other women this month. Denies pain or problems urinating.

## 2018-11-15 NOTE — ED Provider Notes (Signed)
[facility] EMERGENCY DEPARTMENT Provider Note   CSN: [id number] Arrival date & time: 11/15/18  0114     History   Chief Complaint Chief Complaint  Patient presents with  . Penile Discharge    HPI Jerome Simpson is a 23 y.o. male.  Patient presents to the emergency department with a chief complaint of penile discharge.  He states that he noticed the symptoms today.  He reports new sexual partners.  Denies any dysuria or hematuria.  States that he has clear discharge after he finishes urinating.  Denies any pain.  No treatments prior to arrival.  The history is provided by the patient. No language interpreter was used.    Past Medical History:  Diagnosis Date  . STD (male)     There are no active problems to display for this patient.   History reviewed. No pertinent surgical history.      Home Medications    Prior to Admission medications   Medication Sig Start Date End Date Taking? Authorizing Provider  benzonatate (TESSALON) 100 MG capsule Take 2 capsules (200 mg total) by mouth 2 (two) times daily as needed for cough. Patient not taking: Reported on 11/15/2018 11/24/16   Roxy Horseman, PA-C    Family History No family history on file.  Social History Social History   Tobacco Use  . Smoking status: Current Every Day Smoker    Packs/day: 1.00    Years: 0.00    Pack years: 0.00    Types: Cigarettes  . Smokeless tobacco: Current User  Substance Use Topics  . Alcohol use: Yes  . Drug use: Yes    Types: Marijuana     Allergies   Shrimp [shellfish allergy]   Review of Systems Review of Systems  All other systems reviewed and are negative.    Physical Exam Updated Vital Signs BP 127/80 (BP Location: Right Arm)   Pulse 98   Temp 99.2 F (37.3 C) (Oral)   Resp 16   SpO2 99%   Physical Exam Vitals signs and nursing note reviewed.  Constitutional:      Appearance: He is well-developed.  HENT:     Head: Normocephalic  and atraumatic.  Eyes:     Conjunctiva/sclera: Conjunctivae normal.  Neck:     Musculoskeletal: Normal range of motion.  Cardiovascular:     Rate and Rhythm: Normal rate.  Pulmonary:     Effort: Pulmonary effort is normal.  Abdominal:     General: There is no distension.  Genitourinary:    Comments: No masses or lesions, mild clear discharge Musculoskeletal: Normal range of motion.  Skin:    General: Skin is dry.  Neurological:     Mental Status: He is alert and oriented to person, place, and time.  Psychiatric:        Behavior: Behavior normal.        Thought Content: Thought content normal.        Judgment: Judgment normal.      ED Treatments / Results  Labs (all labs ordered are listed, but only abnormal results are displayed) Labs Reviewed  GC/CHLAMYDIA PROBE AMP ([place]) NOT AT [facility]    EKG None  Radiology No results found.  Procedures Procedures (including critical care time)  Medications Ordered in ED Medications  azithromycin (ZITHROMAX) tablet 1,000 mg (has no administration in time range)  cefTRIAXone (ROCEPHIN) injection 250 mg (has no administration in time range)     Initial Impression / Assessment and Plan /  ED Course  I have reviewed the triage vital signs and the nursing notes.  Pertinent labs & imaging results that were available during my care of the patient were reviewed by me and considered in my medical decision making (see chart for details).     Patient with new penile discharge.  Will treat with azithromycin and Rocephin.  Final Clinical Impressions(s) / ED Diagnoses   Final diagnoses:  Penile discharge    ED Discharge Orders    None       Roxy Horseman, PA-C 11/15/18 0221    Ward, Layla Maw, DO 11/15/18 [id number]

## 2018-11-15 NOTE — ED Notes (Signed)
See provider note for further assessment 

## 2018-11-16 LAB — GC/CHLAMYDIA PROBE AMP (~~LOC~~) NOT AT ARMC
Chlamydia: NEGATIVE
Neisseria Gonorrhea: NEGATIVE

## 2019-04-16 ENCOUNTER — Emergency Department (HOSPITAL_COMMUNITY)
Admission: EM | Admit: 2019-04-16 | Discharge: 2019-04-16 | Disposition: A | Discharge status: Home / Self Care | Payer: Federal, State, Local not specified - PPO | Attending: Emergency Medicine | Admitting: Emergency Medicine

## 2019-04-16 ENCOUNTER — Other Ambulatory Visit: Payer: Self-pay

## 2019-04-16 ENCOUNTER — Encounter (HOSPITAL_COMMUNITY): Payer: Self-pay | Admitting: *Deleted

## 2019-04-16 DIAGNOSIS — Z202 Contact with and (suspected) exposure to infections with a predominantly sexual mode of transmission: Secondary | ICD-10-CM | POA: Diagnosis not present

## 2019-04-16 DIAGNOSIS — R369 Urethral discharge, unspecified: Secondary | ICD-10-CM | POA: Diagnosis present

## 2019-04-16 DIAGNOSIS — F17210 Nicotine dependence, cigarettes, uncomplicated: Secondary | ICD-10-CM | POA: Diagnosis not present

## 2019-04-16 DIAGNOSIS — F1210 Cannabis abuse, uncomplicated: Secondary | ICD-10-CM | POA: Diagnosis not present

## 2019-04-16 LAB — URINALYSIS, ROUTINE W REFLEX MICROSCOPIC
Bilirubin Urine: NEGATIVE
Glucose, UA: NEGATIVE mg/dL
Hgb urine dipstick: NEGATIVE
Ketones, ur: NEGATIVE mg/dL
Nitrite: NEGATIVE
Protein, ur: NEGATIVE mg/dL
Specific Gravity, Urine: 1.015 (ref 1.005–1.030)
pH: 6 (ref 5.0–8.0)

## 2019-04-16 MED ORDER — AZITHROMYCIN 1 G PO PACK
1.0000 g | PACK | Freq: Once | ORAL | Status: AC
  Administered 2019-04-16: 1 g via ORAL
  Filled 2019-04-16: qty 1

## 2019-04-16 MED ORDER — LIDOCAINE HCL (PF) 1 % IJ SOLN
INTRAMUSCULAR | Status: AC
  Administered 2019-04-16: 1 mL
  Filled 2019-04-16: qty 5

## 2019-04-16 MED ORDER — CEFTRIAXONE SODIUM 250 MG IJ SOLR
250.0000 mg | Freq: Once | INTRAMUSCULAR | Status: AC
  Administered 2019-04-16: 250 mg via INTRAMUSCULAR
  Filled 2019-04-16: qty 250

## 2019-04-16 NOTE — ED Notes (Signed)
Patient verbalizes understanding of discharge instructions. Opportunity for questioning and answers were provided. Armband removed by staff, pt discharged from ED.  

## 2019-04-16 NOTE — ED Triage Notes (Signed)
Pt was contacted by a partner that they have gonorrhea and chlamydia, so pt needs treatment. Pt has been having clear/yellow penile discharge for today.

## 2019-04-16 NOTE — ED Provider Notes (Signed)
[facility]   CSN: [id number] Arrival date & time: 04/16/19  2204    History   Chief Complaint Chief Complaint  Patient presents with  . Penile Discharge    HPI Sherri RadHarley Vari is a 23 y.o. male who presents to the ED today for penile discharge that started yesterday. Pt reports he got a call from a sexual partner today who tested positive for both gonorrhea and chlamydia. He has informed his other partners as well. Denies penile pain, testicular pain, testicular swelling, fever, chills, or any other complaints.        Past Medical History:  Diagnosis Date  . STD (male)     There are no active problems to display for this patient.   History reviewed. No pertinent surgical history.      Home Medications    Prior to Admission medications   Medication Sig Start Date End Date Taking? Authorizing Provider  benzonatate (TESSALON) 100 MG capsule Take 2 capsules (200 mg total) by mouth 2 (two) times daily as needed for cough. Patient not taking: Reported on 11/15/2018 11/24/16   Roxy HorsemanBrowning, Robert, PA-C    Family History No family history on file.  Social History Social History   Tobacco Use  . Smoking status: Current Every Day Smoker    Packs/day: 1.00    Years: 0.00    Pack years: 0.00    Types: Cigarettes  . Smokeless tobacco: Current User  Substance Use Topics  . Alcohol use: Yes  . Drug use: Yes    Types: Marijuana     Allergies   Shrimp [shellfish allergy]   Review of Systems Review of Systems  Constitutional: Negative for chills and fever.  Genitourinary: Positive for discharge. Negative for dysuria, frequency, penile pain, penile swelling, scrotal swelling and testicular pain.     Physical Exam Updated Vital Signs BP 125/80   Pulse 75   Temp 98.3 F (36.8 C) (Oral)   Resp 14   SpO2 100%   Physical Exam Vitals signs and nursing note reviewed.  Constitutional:      Appearance: He is  not ill-appearing.  HENT:     Head: Normocephalic and atraumatic.  Eyes:     Conjunctiva/sclera: Conjunctivae normal.  Cardiovascular:     Rate and Rhythm: Normal rate and regular rhythm.  Pulmonary:     Effort: Pulmonary effort is normal.     Breath sounds: Normal breath sounds.  Genitourinary:    Comments: Chaperone present for exam Circumcised penis without phimosis/paraphimosis, hypospadias, erythema, tenderness, or discharge. Pt does point to discharge on his underwear. No rashes or lesions. Testes with no masses or tenderness, no swelling, and cremasterics reflex present bilaterally. No abnormal lie. No inguinal hernias or adenopathy present.  Skin:    General: Skin is warm and dry.     Coloration: Skin is not jaundiced.  Neurological:     Mental Status: He is alert.      ED Treatments / Results  Labs (all labs ordered are listed, but only abnormal results are displayed) Labs Reviewed  URINALYSIS, ROUTINE W REFLEX MICROSCOPIC  GC/CHLAMYDIA PROBE AMP ([place]    EKG None  Radiology No results found.  Procedures Procedures (including critical care time)  Medications Ordered in ED Medications  cefTRIAXone (ROCEPHIN) injection 250 mg (250 mg Intramuscular Given 04/16/19 2334)  azithromycin (ZITHROMAX) powder 1 g (1 g Oral Given 04/16/19 2334)  lidocaine (PF) (XYLOCAINE) 1 % injection (1 mL  Given 04/16/19 2334)     Initial Impression / Assessment and Plan / ED Course  I have reviewed the triage vital signs and the nursing notes.  Pertinent labs & imaging results that were available during my care of the patient were reviewed by me and considered in my medical decision making (see chart for details).    Pt is a 23 year old male who presents to the ED today for penile discharge/his partner tested positive for gonorrhea and chlamydia. No other symptoms today including testicular pain or swelling to suggest any kind of epididymitis or orchitis. No  fever or chills. Will obtain urine sample today with GC/chlamydia from urine. Will treat prophylacticly given partner tested positive. Pt advised to refrain from intercourse for the next 10 days. He is in agreement with plan and stable for discharge home.       Final Clinical Impressions(s) / ED Diagnoses   Final diagnoses:  Penile discharge  Exposure to STD    ED Discharge Orders    None       Eustaquio Maize, PA-C 04/16/19 2355    Charlesetta Shanks, MD 04/17/19 2127

## 2019-04-16 NOTE — Discharge Instructions (Signed)
You have been treated for gonorrhea and chlamydia in the ER but the hospital will call you if lab is positive. ° °NO SEXUAL INTERCOURSE FOR AT LEAST 10 DAYS AFTER TODAY'S VISIT, THIS WILL INVALIDATE YOUR TREATMENT HERE. DO NOT ENGAGE IN SEXUAL ACTIVITY UNTIL YOU FIND OUT ABOUT YOUR RESULTS AND HAVE PARTNERS TESTED AND TREATED. ALL PARTNERS MUST BE TESTED AND TREATED FOR STD'S. ALWAYS USE CONDOMS WHEN ENGAGING IN INTERCOURSE.  ° °Follow up with [facility] STD clinic for future STD concerns or screenings.  °Follow up with your regular doctor in 1 week for recheck of symptoms.  ° ° °

## 2019-04-18 LAB — GC/CHLAMYDIA PROBE AMP (~~LOC~~) NOT AT ARMC
Chlamydia: POSITIVE — AB
Neisseria Gonorrhea: POSITIVE — AB

## 2019-05-06 ENCOUNTER — Emergency Department (HOSPITAL_COMMUNITY)
Admission: EM | Admit: 2019-05-06 | Discharge: 2019-05-06 | Disposition: A | Discharge status: Home / Self Care | Payer: Federal, State, Local not specified - PPO | Attending: Emergency Medicine | Admitting: Emergency Medicine

## 2019-05-06 ENCOUNTER — Encounter (HOSPITAL_COMMUNITY): Payer: Self-pay | Admitting: Emergency Medicine

## 2019-05-06 ENCOUNTER — Other Ambulatory Visit: Payer: Self-pay

## 2019-05-06 DIAGNOSIS — S39012A Strain of muscle, fascia and tendon of lower back, initial encounter: Secondary | ICD-10-CM | POA: Diagnosis not present

## 2019-05-06 DIAGNOSIS — Y92410 Unspecified street and highway as the place of occurrence of the external cause: Secondary | ICD-10-CM | POA: Insufficient documentation

## 2019-05-06 DIAGNOSIS — Y999 Unspecified external cause status: Secondary | ICD-10-CM | POA: Diagnosis not present

## 2019-05-06 DIAGNOSIS — F17210 Nicotine dependence, cigarettes, uncomplicated: Secondary | ICD-10-CM | POA: Diagnosis not present

## 2019-05-06 DIAGNOSIS — Y9389 Activity, other specified: Secondary | ICD-10-CM | POA: Diagnosis not present

## 2019-05-06 DIAGNOSIS — S3992XA Unspecified injury of lower back, initial encounter: Secondary | ICD-10-CM | POA: Diagnosis present
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
## 2019-05-06 NOTE — Discharge Instructions (Addendum)
Take Motrin and Tylenol as needed as directed.  Apply over-the-counter pain strips to low back as needed for pain.  Apply warm compresses to low back for 30 minutes at a time. Follow-up with your primary care provider if not improving as expected, return to ER for severe or concerning symptoms.

## 2019-10-20 ENCOUNTER — Other Ambulatory Visit: Payer: Self-pay

## 2019-10-20 ENCOUNTER — Emergency Department (HOSPITAL_COMMUNITY)
Admission: EM | Admit: 2019-10-20 | Discharge: 2019-10-20 | Disposition: A | Discharge status: Home / Self Care | Payer: Federal, State, Local not specified - PPO | Attending: Emergency Medicine | Admitting: Emergency Medicine

## 2019-10-20 ENCOUNTER — Encounter (HOSPITAL_COMMUNITY): Payer: Self-pay | Admitting: Emergency Medicine

## 2019-10-20 DIAGNOSIS — F17210 Nicotine dependence, cigarettes, uncomplicated: Secondary | ICD-10-CM | POA: Diagnosis not present

## 2019-10-20 DIAGNOSIS — F1210 Cannabis abuse, uncomplicated: Secondary | ICD-10-CM | POA: Insufficient documentation

## 2019-10-20 DIAGNOSIS — B86 Scabies: Secondary | ICD-10-CM | POA: Diagnosis not present

## 2019-10-20 DIAGNOSIS — R21 Rash and other nonspecific skin eruption: Secondary | ICD-10-CM | POA: Diagnosis present

## 2019-10-20 MED ORDER — PERMETHRIN 5 % EX CREA: TOPICAL_CREAM | CUTANEOUS | 0 refills | Status: DC

## 2019-10-20 NOTE — Discharge Instructions (Addendum)
Use the permethrin as directed. Follow instructions regarding management of scabies. You can take Benadryl or other over-the-counter antihistamines as needed for itching. Return to the ED if you start to have worsening symptoms, worsening rash, lip swelling, trouble breathing or trouble swallowing.

## 2019-10-20 NOTE — ED Notes (Signed)
Patient verbalizes understanding of discharge instructions. Opportunity for questioning and answers were provided. Pt discharged from ED. 

## 2019-10-20 NOTE — ED Provider Notes (Signed)
[facility] EMERGENCY DEPARTMENT Provider Note   CSN: [id number] Arrival date & time: 10/20/19  1249     History Chief Complaint  Patient presents with  . Rash    Jerome Simpson is a 23 y.o. male who presents to ED with a chief complaint of rash.  For the past 3 weeks been having itching around his left hand and around his buttocks.  He is concerned that he may have scabies as he has had scabies in the past and states that this feels similar.  He has not tried any medications to help with his symptoms.  Denies any lip swelling, trouble breathing, trouble swallowing, new environmental exposures, sick contacts with similar symptoms.  HPI     Past Medical History:  Diagnosis Date  . STD (male)     There are no problems to display for this patient.   History reviewed. No pertinent surgical history.     History reviewed. No pertinent family history.  Social History   Tobacco Use  . Smoking status: Current Every Day Smoker    Packs/day: 1.00    Years: 0.00    Pack years: 0.00    Types: Cigarettes  . Smokeless tobacco: Current User  Substance Use Topics  . Alcohol use: Yes  . Drug use: Yes    Types: Marijuana    Home Medications Prior to Admission medications   Medication Sig Start Date End Date Taking? Authorizing Provider  benzonatate (TESSALON) 100 MG capsule Take 2 capsules (200 mg total) by mouth 2 (two) times daily as needed for cough. Patient not taking: Reported on 11/15/2018 11/24/16   Roxy Horseman, PA-C  permethrin (ELIMITE) 5 % cream Apply to affected area once 10/20/19   Trine Fread, PA-C    Allergies    Shrimp [shellfish allergy]  Review of Systems   Review of Systems  Constitutional: Negative for chills and fever.  HENT: Negative for facial swelling.   Respiratory: Negative for shortness of breath.   Skin: Positive for rash.    Physical Exam Updated Vital Signs BP 125/81 (BP Location: Right Arm)   Pulse 71   Temp  98.1 F (36.7 C) (Oral)   Resp 16   Ht 6' (1.829 m)   Wt 81.6 kg   SpO2 98%   BMI 24.41 kg/m   Physical Exam Vitals and nursing note reviewed.  Constitutional:      General: He is not in acute distress.    Appearance: He is well-developed. He is not diaphoretic.  HENT:     Head: Normocephalic and atraumatic.  Eyes:     General: No scleral icterus.    Conjunctiva/sclera: Conjunctivae normal.  Pulmonary:     Effort: Pulmonary effort is normal. No respiratory distress.  Musculoskeletal:     Cervical back: Normal range of motion.  Skin:    Findings: Rash present.     Comments: Erythematous scabies like rash noted between webspaces of left hand and on dorsum of left hand.  Neurological:     Mental Status: He is alert.     ED Results / Procedures / Treatments   Labs (all labs ordered are listed, but only abnormal results are displayed) Labs Reviewed - No data to display  EKG None  Radiology No results found.  Procedures Procedures (including critical care time)  Medications Ordered in ED Medications - No data to display  ED Course  I have reviewed the triage vital signs and the nursing notes.  Pertinent labs &  imaging results that were available during my care of the patient were reviewed by me and considered in my medical decision making (see chart for details).    MDM Rules/Calculators/A&P                      Pt has a patent airway without stridor and is handling secretions without difficulty; no angioedema. No blisters, no pustules, no warmth, no draining sinus tracts, no superficial abscesses, no bullous impetigo, no vesicles, no desquamation, no target lesions with dusky purpura or a central bulla. Not tender to touch. No concern for superimposed infection. No concern for SJS, TEN, TSS, tick borne illness, syphilis or other life-threatening condition.  Will treat for scabies as patient reports history of same.  Patient is hemodynamically stable, in NAD, and  able to ambulate in the ED. Evaluation does not show pathology that would require ongoing emergent intervention or inpatient treatment. I explained the diagnosis to the patient. Pain has been managed and has no complaints prior to discharge. Patient is comfortable with above plan and is stable for discharge at this time. All questions were answered prior to disposition. Strict return precautions for returning to the ED were discussed. Encouraged follow up with PCP.   An After Visit Summary was printed and given to the patient.   Portions of this note were generated with [employer]. Dictation errors may occur despite best attempts at proofreading.   Final Clinical Impression(s) / ED Diagnoses Final diagnoses:  Scabies    Rx / DC Orders ED Discharge Orders         Ordered    permethrin (ELIMITE) 5 % cream     10/20/19 [place], Johneisha Broaden, PA-C 10/20/19 1442    Lennice Sites, DO 10/20/19 1555

## 2019-10-20 NOTE — ED Triage Notes (Addendum)
Pt reports itching for the last 3 months. Has minor rash to hand. Itching around buttocks. NAD at present.

## 2019-11-27 ENCOUNTER — Other Ambulatory Visit: Payer: Self-pay

## 2019-11-27 ENCOUNTER — Encounter (HOSPITAL_COMMUNITY): Payer: Self-pay | Admitting: Emergency Medicine

## 2019-11-27 ENCOUNTER — Emergency Department (HOSPITAL_COMMUNITY)
Admission: EM | Admit: 2019-11-27 | Discharge: 2019-11-27 | Disposition: A | Discharge status: Home / Self Care | Payer: Federal, State, Local not specified - PPO | Attending: Emergency Medicine | Admitting: Emergency Medicine

## 2019-11-27 DIAGNOSIS — F17210 Nicotine dependence, cigarettes, uncomplicated: Secondary | ICD-10-CM | POA: Insufficient documentation

## 2019-11-27 DIAGNOSIS — Y999 Unspecified external cause status: Secondary | ICD-10-CM | POA: Diagnosis not present

## 2019-11-27 DIAGNOSIS — Y929 Unspecified place or not applicable: Secondary | ICD-10-CM | POA: Diagnosis not present

## 2019-11-27 DIAGNOSIS — Y939 Activity, unspecified: Secondary | ICD-10-CM | POA: Diagnosis not present

## 2019-11-27 DIAGNOSIS — S0592XA Unspecified injury of left eye and orbit, initial encounter: Secondary | ICD-10-CM | POA: Diagnosis present

## 2019-11-27 DIAGNOSIS — S01112A Laceration without foreign body of left eyelid and periocular area, initial encounter: Secondary | ICD-10-CM | POA: Diagnosis not present

## 2019-11-27 MED ORDER — OXYCODONE-ACETAMINOPHEN 5-325 MG PO TABS
1.0000 | ORAL_TABLET | Freq: Once | ORAL | Status: AC
  Administered 2019-11-27: 14:00:00 1 via ORAL
  Filled 2019-11-27: qty 1

## 2019-11-27 MED ORDER — LIDOCAINE HCL (PF) 1 % IJ SOLN
10.0000 mL | Freq: Once | INTRAMUSCULAR | Status: AC
  Administered 2019-11-27: 5 mL via INTRADERMAL
  Filled 2019-11-27: qty 10
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
## 2019-11-27 NOTE — ED Notes (Signed)
Non adhesive drg applied over the stitches above pt's Lt eye.
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
## 2019-11-27 NOTE — Discharge Instructions (Addendum)
As discussed, you will need your sutures removed in 3-5 days. You can come back here or to urgent care. Keep area clean. You may use over the counter antibiotic ointment. Return to the ER for new or worsening symptoms.

## 2019-12-07 ENCOUNTER — Emergency Department (HOSPITAL_COMMUNITY)
Admission: EM | Admit: 2019-12-07 | Discharge: 2019-12-07 | Disposition: A | Discharge status: Home / Self Care | Payer: Federal, State, Local not specified - PPO | Attending: Emergency Medicine | Admitting: Emergency Medicine

## 2019-12-07 DIAGNOSIS — S01112D Laceration without foreign body of left eyelid and periocular area, subsequent encounter: Secondary | ICD-10-CM | POA: Diagnosis present

## 2019-12-07 DIAGNOSIS — X58XXXD Exposure to other specified factors, subsequent encounter: Secondary | ICD-10-CM | POA: Insufficient documentation

## 2019-12-07 DIAGNOSIS — F17210 Nicotine dependence, cigarettes, uncomplicated: Secondary | ICD-10-CM | POA: Diagnosis not present

## 2019-12-07 DIAGNOSIS — Z4802 Encounter for removal of sutures: Secondary | ICD-10-CM
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
## 2019-12-07 NOTE — ED Notes (Signed)
Patient verbalizes understanding of discharge instructions. Opportunity for questioning and answers were provided. Armband removed by staff, pt discharged from ED.  

## 2020-06-18 ENCOUNTER — Emergency Department (HOSPITAL_COMMUNITY)
Admission: EM | Admit: 2020-06-18 | Discharge: 2020-06-18 | Disposition: A | Discharge status: Home / Self Care | Payer: Federal, State, Local not specified - PPO | Attending: Emergency Medicine | Admitting: Emergency Medicine

## 2020-06-18 ENCOUNTER — Encounter (HOSPITAL_COMMUNITY): Payer: Self-pay | Admitting: Emergency Medicine

## 2020-06-18 ENCOUNTER — Other Ambulatory Visit: Payer: Self-pay

## 2020-06-18 DIAGNOSIS — R369 Urethral discharge, unspecified: Secondary | ICD-10-CM

## 2020-06-18 DIAGNOSIS — R300 Dysuria: Secondary | ICD-10-CM | POA: Insufficient documentation

## 2020-06-18 DIAGNOSIS — F1590 Other stimulant use, unspecified, uncomplicated: Secondary | ICD-10-CM | POA: Insufficient documentation

## 2020-06-18 DIAGNOSIS — F17210 Nicotine dependence, cigarettes, uncomplicated: Secondary | ICD-10-CM | POA: Insufficient documentation

## 2020-06-18 LAB — URINALYSIS, ROUTINE W REFLEX MICROSCOPIC
Bilirubin Urine: NEGATIVE
Glucose, UA: NEGATIVE mg/dL
Hgb urine dipstick: NEGATIVE
Ketones, ur: NEGATIVE mg/dL
Nitrite: NEGATIVE
Protein, ur: NEGATIVE mg/dL
Specific Gravity, Urine: 1.014 (ref 1.005–1.030)
WBC, UA: >50 WBC/hpf — ABNORMAL HIGH (ref 0–5)
pH: 8 (ref 5.0–8.0)

## 2020-06-18 MED ORDER — DOXYCYCLINE HYCLATE 100 MG PO TABS
100.0000 mg | ORAL_TABLET | Freq: Once | ORAL | Status: AC
  Administered 2020-06-18: 100 mg via ORAL
  Filled 2020-06-18: qty 1

## 2020-06-18 MED ORDER — DOXYCYCLINE HYCLATE 100 MG PO CAPS: 100.0000 mg | ORAL_CAPSULE | Freq: Two times a day (BID) | ORAL | 0 refills | Status: AC

## 2020-06-18 MED ORDER — CEFTRIAXONE SODIUM 500 MG IJ SOLR
500.0000 mg | Freq: Once | INTRAMUSCULAR | Status: AC
  Administered 2020-06-18: 500 mg via INTRAMUSCULAR
  Filled 2020-06-18: qty 500

## 2020-06-18 MED ORDER — LIDOCAINE HCL (PF) 1 % IJ SOLN
5.0000 mL | Freq: Once | INTRAMUSCULAR | Status: AC
  Administered 2020-06-18: 5 mL
  Filled 2020-06-18: qty 5
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
## 2020-06-18 NOTE — ED Provider Notes (Signed)
[facility] EMERGENCY DEPARTMENT Provider Note   CSN: [id number] Arrival date & time: 06/18/20  1629     History Chief Complaint  Patient presents with  . Penile Discharge  . Dysuria    Jerome Simpson is a 24 y.o. male.  The history is provided by the patient and medical records. No language interpreter was used.  Penile Discharge This is a recurrent problem. The current episode started more than 2 days ago. The problem occurs constantly. The problem has not changed since onset.Pertinent negatives include no chest pain, no abdominal pain, no headaches and no shortness of breath. Nothing aggravates the symptoms. Nothing relieves the symptoms. He has tried nothing for the symptoms.  Dysuria Presenting symptoms: dysuria and penile discharge   Associated symptoms: no abdominal pain, no fever, no flank pain, no penile swelling, no scrotal swelling and no urinary frequency        Past Medical History:  Diagnosis Date  . STD (male)     There are no problems to display for this patient.   History reviewed. No pertinent surgical history.     No family history on file.  Social History   Tobacco Use  . Smoking status: Current Every Day Smoker    Packs/day: 1.00    Years: 0.00    Pack years: 0.00    Types: Cigarettes  . Smokeless tobacco: Current User  Substance Use Topics  . Alcohol use: Yes  . Drug use: Yes    Types: Marijuana    Home Medications Prior to Admission medications   Medication Sig Start Date End Date Taking? Authorizing Provider  oxyCODONE-acetaminophen (PERCOCET/ROXICET) 5-325 MG tablet Take 1 tablet by mouth every 4 (four) hours as needed for severe pain (For hand pain).   Yes [provider]  benzonatate (TESSALON) 100 MG capsule Take 2 capsules (200 mg total) by mouth 2 (two) times daily as needed for cough. Patient not taking: Reported on 11/15/2018 11/24/16   Jerome Horseman, PA-C  permethrin (ELIMITE) 5 % cream Apply  to affected area once Patient not taking: Reported on 12/07/2019 10/20/19   Jerome Pates, PA-C    Allergies    Shrimp [shellfish allergy]  Review of Systems   Review of Systems  Constitutional: Negative for chills, diaphoresis, fatigue and fever.  HENT: Negative for congestion.   Respiratory: Negative for shortness of breath.   Cardiovascular: Negative for chest pain.  Gastrointestinal: Negative for abdominal pain.  Genitourinary: Positive for discharge and dysuria. Negative for decreased urine volume, flank pain, frequency, genital sores, penile swelling, scrotal swelling, testicular pain and urgency.  Musculoskeletal: Negative for back pain.  Neurological: Negative for headaches.  Psychiatric/Behavioral: Negative for agitation.  All other systems reviewed and are negative.   Physical Exam Updated Vital Signs BP 121/73 (BP Location: Right Arm)   Pulse 100   Temp 99 F (37.2 C) (Oral)   Resp 16   Ht 6' (1.829 m)   Wt 81.6 kg   SpO2 100%   BMI 24.41 kg/m   Physical Exam Vitals and nursing note reviewed.  Constitutional:      General: He is not in acute distress.    Appearance: He is well-developed. He is not ill-appearing, toxic-appearing or diaphoretic.  HENT:     Head: Normocephalic and atraumatic.  Eyes:     Conjunctiva/sclera: Conjunctivae normal.  Cardiovascular:     Rate and Rhythm: Normal rate and regular rhythm.     Heart sounds: No murmur heard.   Pulmonary:  Effort: Pulmonary effort is normal. No respiratory distress.     Breath sounds: Normal breath sounds. No wheezing, rhonchi or rales.  Chest:     Chest wall: No tenderness.  Abdominal:     General: Abdomen is flat.     Palpations: Abdomen is soft.     Tenderness: There is no abdominal tenderness. There is no right CVA tenderness, left CVA tenderness, guarding or rebound.  Genitourinary:    Comments: Deferred by patient Musculoskeletal:     Cervical back: Neck supple.  Skin:    General: Skin is  warm and dry.     Capillary Refill: Capillary refill takes less than 2 seconds.     Findings: No erythema or rash.  Neurological:     Mental Status: He is alert. Mental status is at baseline.  Psychiatric:        Mood and Affect: Mood normal.     ED Results / Procedures / Treatments   Labs (all labs ordered are listed, but only abnormal results are displayed) Labs Reviewed  URINALYSIS, ROUTINE W REFLEX MICROSCOPIC - Abnormal; Notable for the following components:      Result Value   APPearance HAZY (*)    Leukocytes,Ua LARGE (*)    WBC, UA >50 (*)    Bacteria, UA RARE (*)    All other components within normal limits  URINE CULTURE  GC/CHLAMYDIA PROBE AMP ([place]) NOT AT [facility]    EKG None  Radiology No results found.  Procedures Procedures (including critical care time)  Medications Ordered in ED Medications  cefTRIAXone (ROCEPHIN) injection 500 mg (has no administration in time range)  doxycycline (VIBRA-TABS) tablet 100 mg (has no administration in time range)  lidocaine (PF) (XYLOCAINE) 1 % injection 5 mL (has no administration in time range)    ED Course  I have reviewed the triage vital signs and the nursing notes.  Pertinent labs & imaging results that were available during my care of the patient were reviewed by me and considered in my medical decision making (see chart for details).    MDM Rules/Calculators/A&P                          Jerome Simpson is a 24 y.o. male with a past medical history significant for sexually-transmitted infections who presents with penile discharge and concern for STD exposure.  He reports that starting Friday, 4 days ago, he started having some burning with urination and penile discharge.  He reports this is "the same" as when he had gonorrhea and chlamydia in the past.  He says that there was a likely exposure and he is already spoken to the partner and they are going to get tested as well.  He reports he had not had  intercourse since Friday.  He does report he recently traveled to [place] but denies any exposures at that time.  He denies any testicle pain or groin pain.  He denies any fevers, chills, shortness of breath, cough, or coronavirus symptoms.  He denies any nausea, vomiting, diarrhea, or other complaints.  He is not interested in any HIV testing or other STD testing.  Patient waited in the waiting room for approximately 6 hours to be seen.  On my exam, lungs are clear and chest is nontender.  Abdomen is nontender.  I cannot palpate any lymph nodes on my exam.  He deferred GU exam and denies any testicle or groin symptoms otherwise.  Urinalysis did  show leukocytes and bacteria.  We added on a GC/committee test.  He did not want HIV testing or other work-up.  Given the discharge, I do suspect this is an STI however with his burning, we will add a urine culture as well.  There were no nitrites so we agreed to hold on treating with other antibiotics for UTI at this time.  Given his lack of pain, doubt abscess, orchitis, epididymitis, or other deep infection.  He reports he will abstain from intercourse until he is fully treated.  He will follow-up with his PCP and follow-up on the results on MyChart.  He had no other questions or concerns and was discharged in good condition after treatment with Rocephin and doxycycline given the new guidelines.  We will treat him with 1 week of doxycycline as an outpatient.  Patient agreed with plan of care and was discharged in good condition.   Final Clinical Impression(s) / ED Diagnoses Final diagnoses:  Penile discharge  Abnormal penile discharge    Rx / DC Orders ED Discharge Orders         Ordered    doxycycline (VIBRAMYCIN) 100 MG capsule  2 times daily     Discontinue  Reprint     06/18/20 2236          Clinical Impression: 1. Penile discharge   2. Abnormal penile discharge     Disposition: Discharge  Condition: Good  I have discussed the  results, Dx and Tx plan with the pt(& family if present). He/she/they expressed understanding and agree(s) with the plan. Discharge instructions discussed at great length. Strict return precautions discussed and pt &/or family have verbalized understanding of the instructions. No further questions at time of discharge.    New Prescriptions   DOXYCYCLINE (VIBRAMYCIN) 100 MG CAPSULE    Take 1 capsule (100 mg total) by mouth 2 (two) times daily for 7 days.    Follow Up: [facility] [address] [place] [address] [phone] Schedule an appointment as soon as possible for a visit    [facility] EMERGENCY DEPARTMENT [address] [id number] mc [place] [address] [phone]       Destin Kittler, Canary Brim, MD 06/18/20 [phone]

## 2020-06-18 NOTE — Discharge Instructions (Signed)
Your symptoms are concerning for sexually transmitted infection.  Given your history of the same, we felt it was appropriate to treat you empirically with antibiotics.  Please take the antibiotics twice a day for the next week and you were treated with the other injection antibiotic today.  We sent off a gonorrhea and chlamydia test today which he will likely be called if it is positive.  He may also follow-up on MyChart or with your PCP.  Please avoid sexual intercourse until you are fully treated and if any symptoms change or worsen, please return to the nearest emergency department.  We also offered further sexually-transmitted infection testing but you declined.  If you change your mind, please return or follow-up with health department.  Please rest and stay hydrated.

## 2020-06-19 LAB — GC/CHLAMYDIA PROBE AMP (~~LOC~~) NOT AT ARMC
Chlamydia: POSITIVE — AB
Comment: NEGATIVE
Comment: NORMAL
Neisseria Gonorrhea: POSITIVE — AB

## 2021-05-01 ENCOUNTER — Other Ambulatory Visit: Payer: Self-pay

## 2021-05-01 ENCOUNTER — Encounter (HOSPITAL_COMMUNITY): Payer: Self-pay

## 2021-05-01 ENCOUNTER — Emergency Department (HOSPITAL_COMMUNITY)
Admission: EM | Admit: 2021-05-01 | Discharge: 2021-05-01 | Disposition: A | Discharge status: Home / Self Care | Payer: Self-pay | Attending: Emergency Medicine | Admitting: Emergency Medicine

## 2021-05-01 DIAGNOSIS — Z5321 Procedure and treatment not carried out due to patient leaving prior to being seen by health care provider: Secondary | ICD-10-CM | POA: Insufficient documentation

## 2021-05-01 DIAGNOSIS — R369 Urethral discharge, unspecified: Secondary | ICD-10-CM | POA: Insufficient documentation

## 2021-05-01 LAB — URINALYSIS, ROUTINE W REFLEX MICROSCOPIC
Bilirubin Urine: NEGATIVE
Glucose, UA: NEGATIVE mg/dL
Hgb urine dipstick: NEGATIVE
Ketones, ur: NEGATIVE mg/dL
Nitrite: NEGATIVE
Protein, ur: 30 mg/dL — AB
Specific Gravity, Urine: 1.02 (ref 1.005–1.030)
pH: 9 — ABNORMAL HIGH (ref 5.0–8.0)

## 2021-05-01 NOTE — ED Notes (Signed)
Called to room pt x4, no response.

## 2021-05-01 NOTE — ED Triage Notes (Signed)
Pt requesting STD check, girlfriend called today stating she tested positive for chlamydia. Pt states he had a "smidge of discharge," no other symptoms.

## 2021-09-18 ENCOUNTER — Encounter (HOSPITAL_COMMUNITY): Payer: Self-pay | Admitting: *Deleted

## 2021-09-18 ENCOUNTER — Emergency Department (HOSPITAL_COMMUNITY)
Admission: EM | Admit: 2021-09-18 | Discharge: 2021-09-18 | Disposition: A | Discharge status: Home / Self Care | Payer: Self-pay | Attending: Emergency Medicine | Admitting: Emergency Medicine

## 2021-09-18 ENCOUNTER — Other Ambulatory Visit: Payer: Self-pay

## 2021-09-18 DIAGNOSIS — Z202 Contact with and (suspected) exposure to infections with a predominantly sexual mode of transmission: Secondary | ICD-10-CM | POA: Insufficient documentation

## 2021-09-18 DIAGNOSIS — F17210 Nicotine dependence, cigarettes, uncomplicated: Secondary | ICD-10-CM | POA: Insufficient documentation

## 2021-09-18 LAB — URINALYSIS, ROUTINE W REFLEX MICROSCOPIC
Bilirubin Urine: NEGATIVE
Glucose, UA: NEGATIVE mg/dL
Hgb urine dipstick: NEGATIVE
Ketones, ur: NEGATIVE mg/dL
Nitrite: NEGATIVE
Protein, ur: NEGATIVE mg/dL
Specific Gravity, Urine: 1.027 (ref 1.005–1.030)
WBC, UA: >50 WBC/hpf — ABNORMAL HIGH (ref 0–5)
pH: 5 (ref 5.0–8.0)

## 2021-09-18 MED ORDER — DOXYCYCLINE HYCLATE 100 MG PO CAPS: 100.0000 mg | ORAL_CAPSULE | Freq: Two times a day (BID) | ORAL | 0 refills | Status: DC

## 2021-09-18 MED ORDER — CEFTRIAXONE SODIUM 500 MG IJ SOLR
500.0000 mg | Freq: Once | INTRAMUSCULAR | Status: AC
  Administered 2021-09-18: 500 mg via INTRAMUSCULAR
  Filled 2021-09-18: qty 500

## 2021-09-18 MED ORDER — LIDOCAINE HCL (PF) 1 % IJ SOLN
INTRAMUSCULAR | Status: AC
  Filled 2021-09-18: qty 5

## 2021-09-18 NOTE — Discharge Instructions (Addendum)
Take doxycycline twice daily for 10 days. This will clear chlamydia. The shot you were given was for gonorrhea. Practice safe sex with condoms.

## 2021-09-18 NOTE — ED Triage Notes (Signed)
Pt reports being exposed to std and needing to be tested.

## 2021-09-18 NOTE — ED Provider Notes (Signed)
[facility] EMERGENCY DEPARTMENT Provider Note   CSN: [id number] Arrival date & time: 09/18/21  1056     History Chief Complaint  Patient presents with   Exposure to STD    Jerome Simpson is a 25 y.o. male.  HPI   Patient presents with STD exposure. One of his partners tested positive for GC and CT two days ago. He has been having penile discharge for 2 days. Denies rashes, declines genital exam. No abdominal pain, nausea, vomiting. Symptoms happened acutely, have not been improving with time.   Past Medical History:  Diagnosis Date   STD (male)     There are no problems to display for this patient.   History reviewed. No pertinent surgical history.     History reviewed. No pertinent family history.  Social History   Tobacco Use   Smoking status: Every Day    Packs/day: 1.00    Years: 0.00    Pack years: 0.00    Types: Cigarettes   Smokeless tobacco: Current  Substance Use Topics   Alcohol use: Yes   Drug use: Yes    Types: Marijuana    Home Medications Prior to Admission medications   Medication Sig Start Date End Date Taking? Authorizing Provider  doxycycline (VIBRAMYCIN) 100 MG capsule Take 1 capsule (100 mg total) by mouth 2 (two) times daily. 09/18/21  Yes Theron Arista, PA-C  benzonatate (TESSALON) 100 MG capsule Take 2 capsules (200 mg total) by mouth 2 (two) times daily as needed for cough. Patient not taking: Reported on 11/15/2018 11/24/16   Roxy Horseman, PA-C  oxyCODONE-acetaminophen (PERCOCET/ROXICET) 5-325 MG tablet Take 1 tablet by mouth every 4 (four) hours as needed for severe pain (For hand pain).    [provider]  permethrin (ELIMITE) 5 % cream Apply to affected area once Patient not taking: Reported on 12/07/2019 10/20/19   Dietrich Pates, PA-C    Allergies    Shrimp [shellfish allergy]  Review of Systems   Review of Systems  Constitutional:  Negative for fever.  Gastrointestinal:  Negative for abdominal  pain, nausea and vomiting.  Genitourinary:  Positive for penile discharge. Negative for dysuria, hematuria, penile swelling and scrotal swelling.   Physical Exam Updated Vital Signs BP 123/73 (BP Location: Right Arm)   Pulse 71   Temp 99 F (37.2 C) (Oral)   Resp 18   SpO2 100%   Physical Exam Vitals and nursing note reviewed. Exam conducted with a chaperone present.  Constitutional:      General: He is not in acute distress.    Appearance: Normal appearance.  HENT:     Head: Normocephalic and atraumatic.  Eyes:     General: No scleral icterus.    Extraocular Movements: Extraocular movements intact.     Pupils: Pupils are equal, round, and reactive to light.  Abdominal:     General: Abdomen is flat.     Tenderness: There is no abdominal tenderness.  Skin:    Coloration: Skin is not jaundiced.  Neurological:     Mental Status: He is alert. Mental status is at baseline.     Coordination: Coordination normal.    ED Results / Procedures / Treatments   Labs (all labs ordered are listed, but only abnormal results are displayed) Labs Reviewed  URINALYSIS, ROUTINE W REFLEX MICROSCOPIC  GC/CHLAMYDIA PROBE AMP ([place]) NOT AT [facility]    EKG None  Radiology No results found.  Procedures Procedures   Medications Ordered in ED Medications  cefTRIAXone (ROCEPHIN) injection 500 mg (has no administration in time range)    ED Course  I have reviewed the triage vital signs and the nursing notes.  Pertinent labs & imaging results that were available during my care of the patient were reviewed by me and considered in my medical decision making (see chart for details).    MDM Rules/Calculators/A&P                           Stable vitals, no acute distress.  Abdomen is soft and non-tender.  Patient declined any genital examination, he denies any rashes however.  Symptoms are consistent with STD and in the situational context of a partner who tested positive for both we  will proceed to treat empirically.  Urine sample collected to run for GC and chlamydia.  Patient discharged in stable condition.  Final Clinical Impression(s) / ED Diagnoses Final diagnoses:  STD exposure    Rx / DC Orders ED Discharge Orders          Ordered    doxycycline (VIBRAMYCIN) 100 MG capsule  2 times daily        09/18/21 1125             Sherrill Raring, PA-C 09/18/21 1129    Varney Biles, MD 09/21/21 2248

## 2021-09-19 LAB — GC/CHLAMYDIA PROBE AMP (~~LOC~~) NOT AT ARMC
Chlamydia: NEGATIVE
Comment: NEGATIVE
Comment: NORMAL
Neisseria Gonorrhea: POSITIVE — AB

## 2021-11-20 ENCOUNTER — Ambulatory Visit: Payer: Self-pay | Admitting: Nurse Practitioner

## 2022-04-28 ENCOUNTER — Emergency Department (HOSPITAL_COMMUNITY)
Admission: EM | Admit: 2022-04-28 | Discharge: 2022-04-28 | Disposition: A | Discharge status: Home / Self Care | Payer: Self-pay | Attending: Emergency Medicine | Admitting: Emergency Medicine

## 2022-04-28 ENCOUNTER — Other Ambulatory Visit: Payer: Self-pay

## 2022-04-28 ENCOUNTER — Encounter (HOSPITAL_COMMUNITY): Payer: Self-pay | Admitting: Emergency Medicine

## 2022-04-28 DIAGNOSIS — Z202 Contact with and (suspected) exposure to infections with a predominantly sexual mode of transmission: Secondary | ICD-10-CM | POA: Insufficient documentation

## 2022-04-28 DIAGNOSIS — R300 Dysuria: Secondary | ICD-10-CM | POA: Insufficient documentation

## 2022-04-28 LAB — URINALYSIS, ROUTINE W REFLEX MICROSCOPIC
Bilirubin Urine: NEGATIVE
Glucose, UA: NEGATIVE mg/dL
Hgb urine dipstick: NEGATIVE
Ketones, ur: NEGATIVE mg/dL
Leukocytes,Ua: NEGATIVE
Nitrite: NEGATIVE
Protein, ur: NEGATIVE mg/dL
Specific Gravity, Urine: 1.013 (ref 1.005–1.030)
pH: 6 (ref 5.0–8.0)

## 2022-04-28 LAB — HIV ANTIBODY (ROUTINE TESTING W REFLEX): HIV Screen 4th Generation wRfx: NONREACTIVE

## 2022-04-28 LAB — RPR: RPR Ser Ql: NONREACTIVE

## 2022-04-28 MED ORDER — DOXYCYCLINE HYCLATE 100 MG PO TABS
100.0000 mg | ORAL_TABLET | Freq: Once | ORAL | Status: AC
  Administered 2022-04-28: 100 mg via ORAL
  Filled 2022-04-28: qty 1

## 2022-04-28 MED ORDER — CEFTRIAXONE SODIUM 500 MG IJ SOLR
500.0000 mg | Freq: Once | INTRAMUSCULAR | Status: AC
  Administered 2022-04-28: 500 mg via INTRAMUSCULAR
  Filled 2022-04-28: qty 500

## 2022-04-28 MED ORDER — LIDOCAINE HCL (PF) 1 % IJ SOLN
INTRAMUSCULAR | Status: AC
  Administered 2022-04-28: 5 mL
  Filled 2022-04-28: qty 5

## 2022-04-28 MED ORDER — DOXYCYCLINE HYCLATE 100 MG PO CAPS: 100.0000 mg | ORAL_CAPSULE | Freq: Two times a day (BID) | ORAL | 0 refills | Status: AC

## 2022-04-28 NOTE — ED Triage Notes (Signed)
Patient requesting STD screening reports sexual partner tested positive for gonorrhea last week , he adds dysuria this evening .
